# Patient Record
Sex: Female | Born: 1995 | Race: White | Hispanic: No | Marital: Married | Smoking: Never smoker
Health system: Southern US, Community
[De-identification: ages and names within clinical notes are randomized; demographics above are authoritative.]

## PROBLEM LIST (undated history)

## (undated) DIAGNOSIS — M419 Scoliosis, unspecified: Secondary | ICD-10-CM

## (undated) DIAGNOSIS — Z789 Other specified health status: Secondary | ICD-10-CM

## (undated) DIAGNOSIS — D696 Thrombocytopenia, unspecified: Secondary | ICD-10-CM

## (undated) HISTORY — PX: WISDOM TOOTH EXTRACTION: SHX21

## (undated) HISTORY — DX: Scoliosis, unspecified: M41.9

## (undated) HISTORY — DX: Thrombocytopenia, unspecified: D69.6

## (undated) HISTORY — DX: Other specified health status: Z78.9

---
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
## 2014-01-31 ENCOUNTER — Telehealth: Payer: Self-pay | Admitting: Nurse Practitioner

## 2014-01-31 NOTE — Telephone Encounter (Signed)
appt made

## 2014-02-01 ENCOUNTER — Telehealth: Payer: Self-pay | Admitting: Nurse Practitioner

## 2014-02-01 NOTE — Telephone Encounter (Signed)
DECIDED TO KEEP APPT.

## 2014-02-11 ENCOUNTER — Ambulatory Visit (INDEPENDENT_AMBULATORY_CARE_PROVIDER_SITE_OTHER): Payer: BC Managed Care – PPO | Admitting: Physician Assistant

## 2014-02-11 ENCOUNTER — Ambulatory Visit: Payer: Self-pay | Admitting: Physician Assistant

## 2014-02-11 ENCOUNTER — Encounter: Payer: Self-pay | Admitting: Physician Assistant

## 2014-02-11 ENCOUNTER — Encounter (INDEPENDENT_AMBULATORY_CARE_PROVIDER_SITE_OTHER): Payer: Self-pay

## 2014-02-11 VITALS — BP 126/77 | HR 97 | Temp 98.3°F | Ht 66.0 in | Wt 138.8 lb

## 2014-02-11 DIAGNOSIS — Z111 Encounter for screening for respiratory tuberculosis: Secondary | ICD-10-CM

## 2014-02-11 DIAGNOSIS — Z23 Encounter for immunization: Secondary | ICD-10-CM

## 2014-02-11 DIAGNOSIS — Z020 Encounter for examination for admission to educational institution: Secondary | ICD-10-CM

## 2014-02-11 NOTE — Progress Notes (Signed)
Patient ID: Lori Porter, female   DOB: 1996/01/14, 18 y.o.   MRN: [id number]   18 y/o healthy appearing  female presents for immunization update for college. No complaints today.   A/P: Forms reviewed. PPD given. Patient will rtc Monday for reading.

## 2014-02-12 LAB — VARICELLA ZOSTER ANTIBODY, IGG: Varicella zoster IgG: 764 index (ref >165)

## 2014-03-26 IMAGING — CR DG CHEST 2V
2 series · 2 of 2 positions shown · non-contrast
Comparison: None.

CLINICAL DATA: Sudden right chest pain

CHEST - 2 VIEW

[view not recorded (1 of 2)]
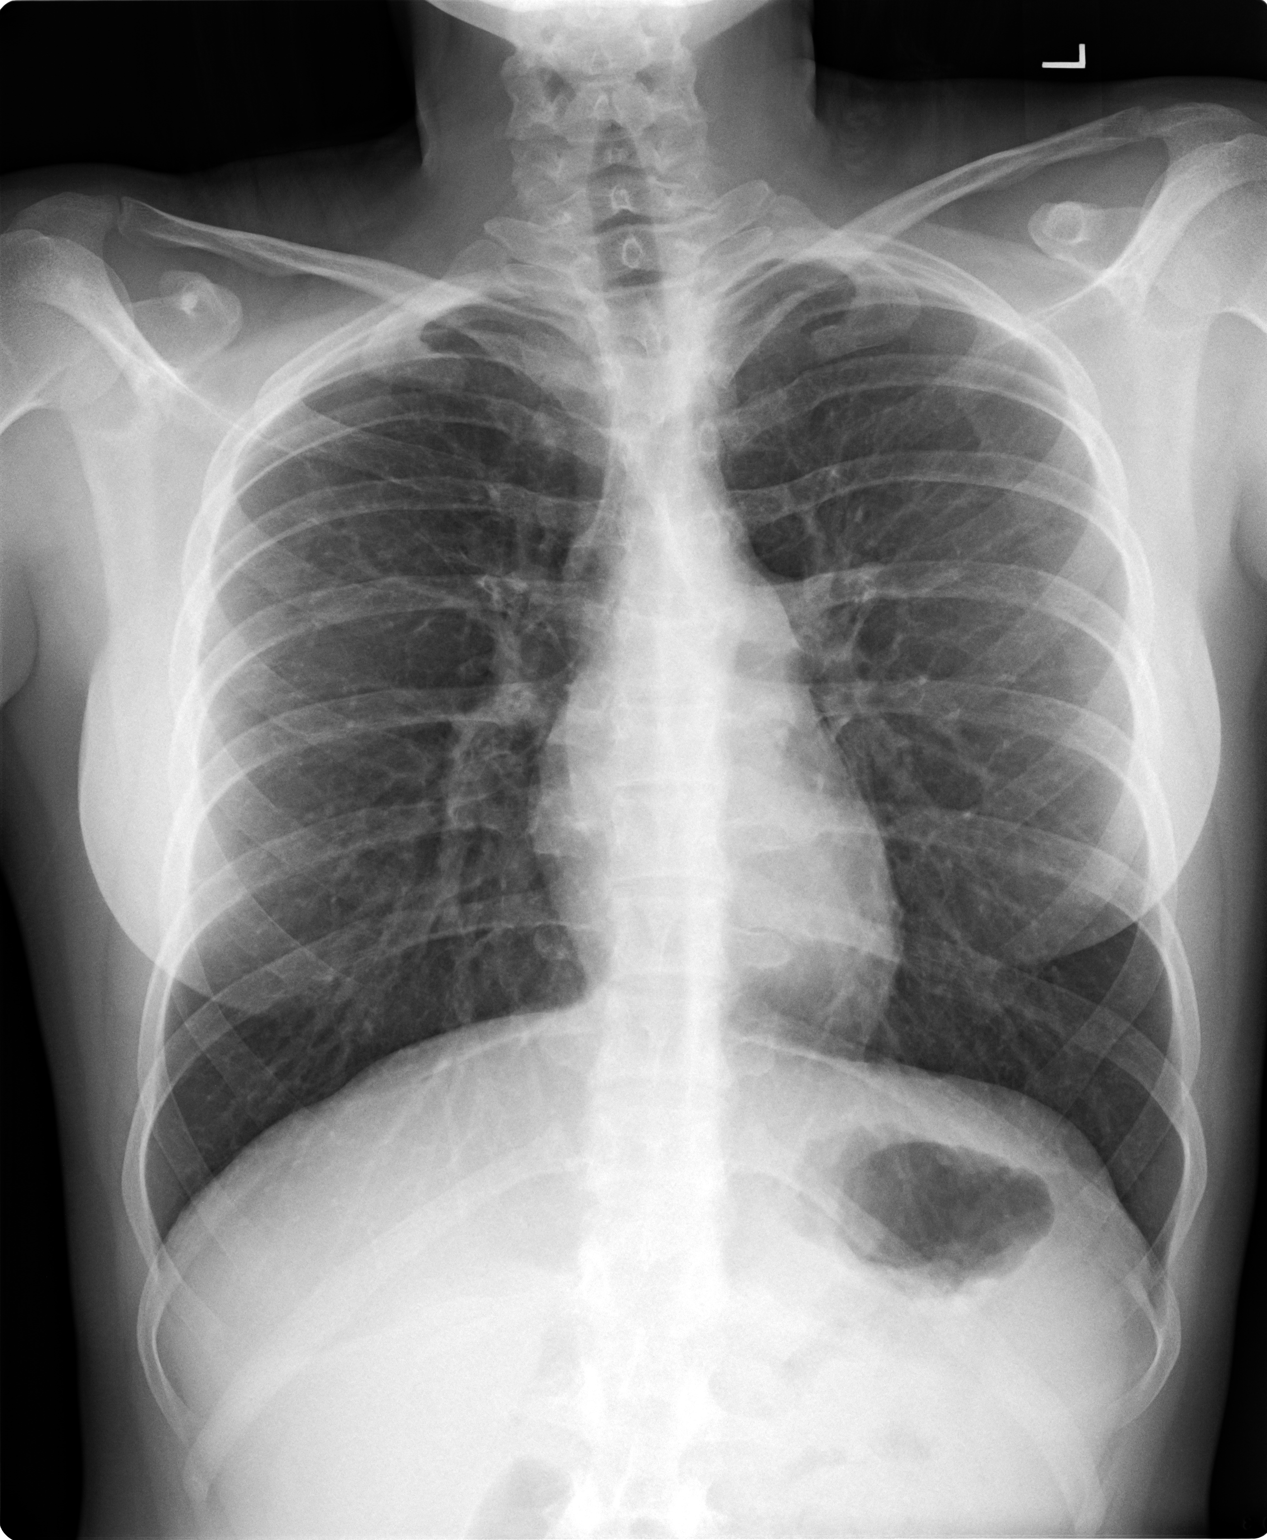

[view not recorded (2 of 2)]
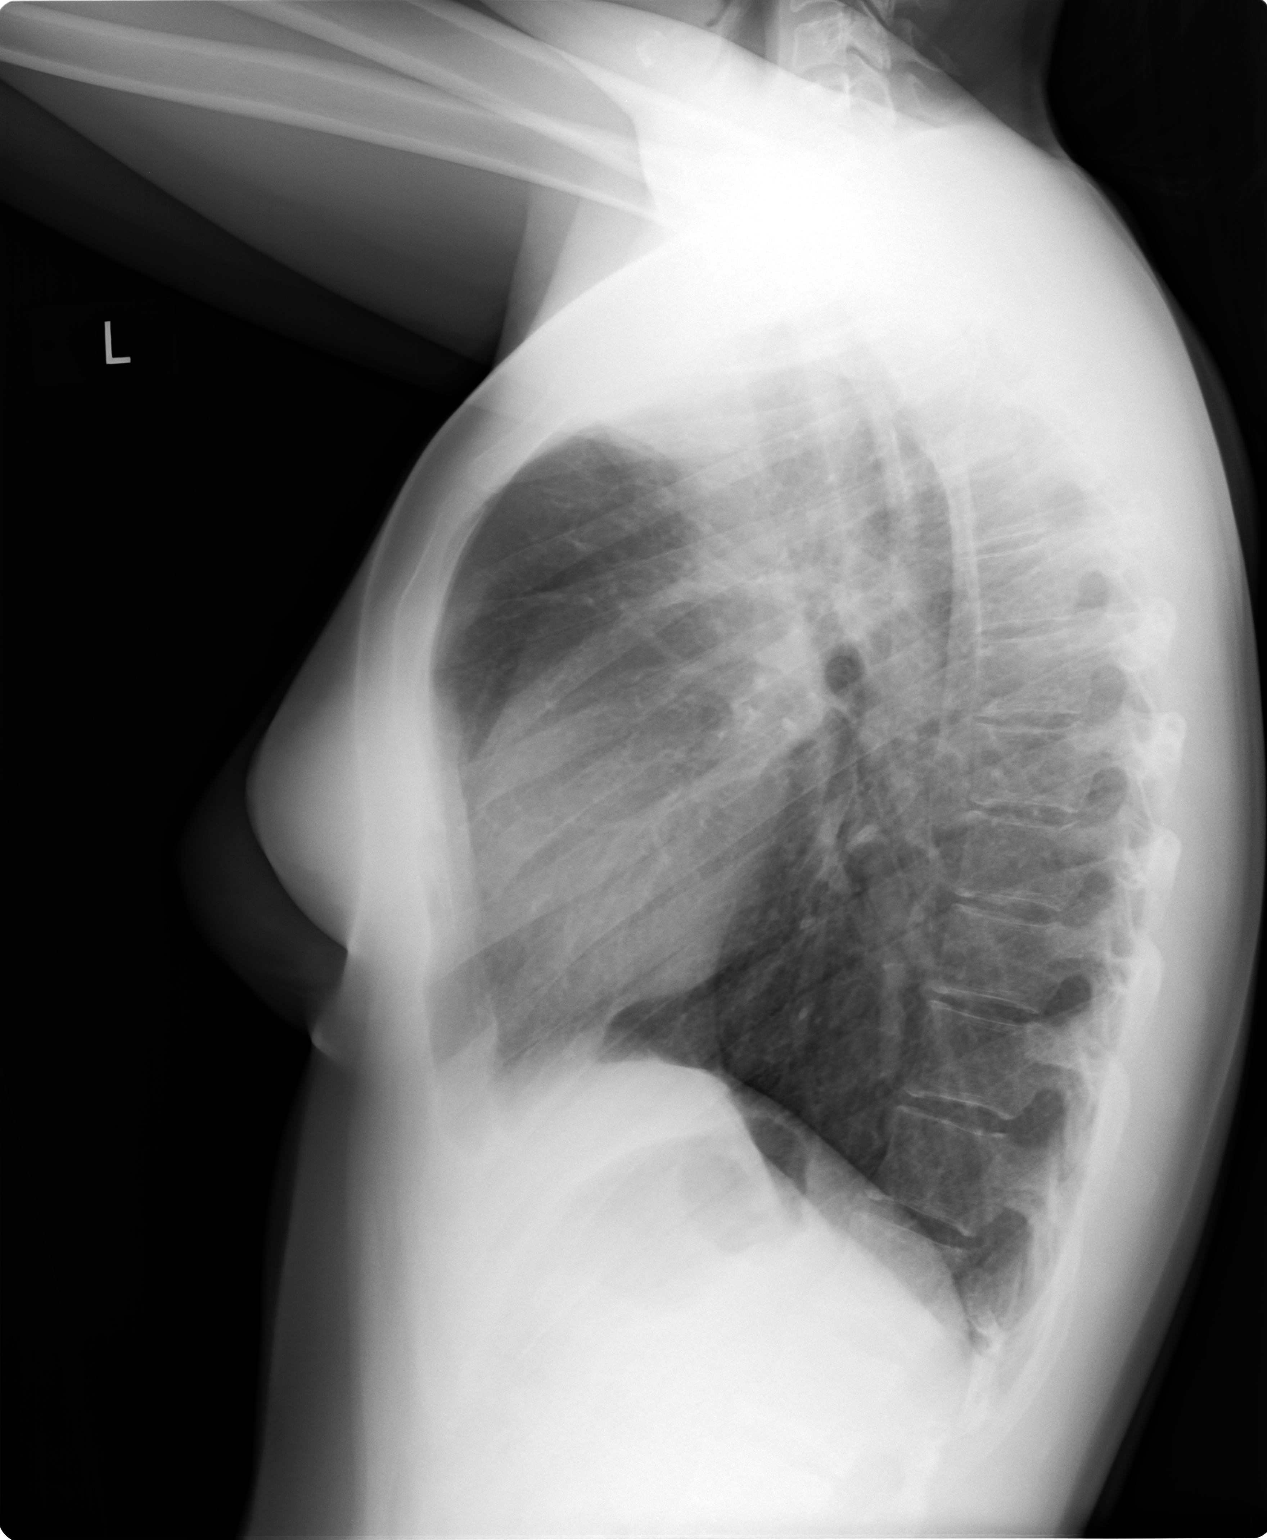

[2 of 2 positions shown; findings below may reference images not displayed]

FINDINGS: The heart and pulmonary vascularity are within normal
limits.  The lungs are clear bilaterally.  No acute bony
abnormality is seen.
IMPRESSION: No acute abnormality noted.

## 2014-07-20 ENCOUNTER — Telehealth: Payer: Self-pay | Admitting: Family Medicine

## 2014-07-20 NOTE — Telephone Encounter (Signed)
Pt aware shot records from the [facility] are at the front desk ready for pickup

## 2015-02-02 ENCOUNTER — Ambulatory Visit (INDEPENDENT_AMBULATORY_CARE_PROVIDER_SITE_OTHER): Payer: 59 | Admitting: Family Medicine

## 2015-02-02 ENCOUNTER — Encounter: Payer: Self-pay | Admitting: Family Medicine

## 2015-02-02 VITALS — BP 132/80 | HR 78 | Temp 97.6°F | Ht 66.0 in | Wt 139.2 lb

## 2015-02-02 DIAGNOSIS — R5383 Other fatigue: Secondary | ICD-10-CM

## 2015-02-02 DIAGNOSIS — E041 Nontoxic single thyroid nodule: Secondary | ICD-10-CM

## 2015-02-02 NOTE — Progress Notes (Signed)
BP 132/80 mmHg  Pulse 78  Temp(Src) 97.6 F (36.4 C) (Oral)  Ht  (1.676 m)  Wt 139 lb 3.2 oz (63.141 kg)  BMI 22.48 kg/m2   Subjective:    Patient ID: Lori Porter, female    DOB: 1995-09-19, 19 y.o.   MRN: [id number]  HPI: Lori Porter is a 19 y.o. female presenting on 02/02/2015 for thyroid check   HPI Fatigue and thyroid nodule Patient is a radiology Engineering geologist and when they were practicing thyroid scans on each other they found 2 small nodules one of which appeared to be cystic and the other not as much. She is also been having some fatigue but denies any hair changes, palpitations, constipation or diarrhea. She denies any chest pain or shortness of breath.  Relevant past medical, surgical, family and social history reviewed and updated as indicated. Interim medical history since our last visit reviewed. Allergies and medications reviewed and updated.  Review of Systems  Constitutional: Positive for fatigue. Negative for fever and chills.  HENT: Negative for congestion, ear discharge and ear pain.   Eyes: Negative for redness and visual disturbance.  Respiratory: Negative for cough, choking, chest tightness and shortness of breath.   Cardiovascular: Negative for chest pain and leg swelling.  Genitourinary: Negative for dysuria and difficulty urinating.  Musculoskeletal: Negative for back pain and gait problem.  Skin: Negative for rash.  Neurological: Negative for light-headedness and headaches.  Psychiatric/Behavioral: Negative for behavioral problems and agitation.  All other systems reviewed and are negative.   Per HPI unless specifically indicated above     Medication List       This list is accurate as of: 02/02/15  4:19 PM.  Always use your most recent med list.               levonorgestrel-ethinyl estradiol 0.1-20 MG-MCG tablet  Commonly known as:  ORSYTHIA  Take 1 tablet by mouth daily.     senna 8.6 MG tablet  Commonly known as:   SENOKOT  Take 1 tablet by mouth daily.           Objective:    BP 132/80 mmHg  Pulse 78  Temp(Src) 97.6 F (36.4 C) (Oral)  Ht  (1.676 m)  Wt 139 lb 3.2 oz (63.141 kg)  BMI 22.48 kg/m2  Wt Readings from Last 3 Encounters:  02/02/15 139 lb 3.2 oz (63.141 kg)  02/11/14 138 lb 12.8 oz (62.959 kg)  09/03/10 133 lb (60.328 kg) (71 %*, Z = 0.55)   * Growth percentiles are based on CDC 2-20 Years data.    Physical Exam  Constitutional: She is oriented to person, place, and time. She appears well-developed and well-nourished. No distress.  Eyes: Conjunctivae and EOM are normal. Pupils are equal, round, and reactive to light.  Neck: Neck supple. No thyromegaly (No palpable nodule) present.  Cardiovascular: Normal rate, regular rhythm, normal heart sounds and intact distal pulses.   No murmur heard. Pulmonary/Chest: Effort normal and breath sounds normal. No respiratory distress. She has no wheezes.  Musculoskeletal: Normal range of motion. She exhibits no edema or tenderness.  Lymphadenopathy:    She has no cervical adenopathy.  Neurological: She is alert and oriented to person, place, and time. Coordination normal.  Skin: Skin is warm and dry. No rash noted. She is not diaphoretic.  Psychiatric: She has a normal mood and affect. Her behavior is normal.  Nursing note and vitals reviewed.   Results for  orders placed or performed in visit on 02/11/14  Varicella zoster antibody, IgG  Result Value Ref Range   Varicella zoster IgG 764 Immune >165 index      Assessment & Plan:   Problem List Items Addressed This Visit    None    Visit Diagnoses    Other fatigue    -  Primary    Relevant Orders    CBC with Differential/Platelet (Completed)    Thyroid Panel With TSH (Completed)    Thyroid nodule        Had thyroid nodule on practice tests at school. We'll test TSH and likely do an ultrasound if TSH is normal.        Follow up plan: Return if symptoms worsen or fail to  improve.  Arville CareJoshua Dettinger, MD [facility] 02/02/2015, 4:19 PM

## 2015-02-03 ENCOUNTER — Other Ambulatory Visit: Payer: Self-pay | Admitting: *Deleted

## 2015-02-03 DIAGNOSIS — E042 Nontoxic multinodular goiter: Secondary | ICD-10-CM

## 2015-02-03 LAB — CBC WITH DIFFERENTIAL/PLATELET
BASOS ABS: 0.1 10*3/uL (ref 0.0–0.2)
Basos: 1 %
EOS (ABSOLUTE): 0.1 10*3/uL (ref 0.0–0.4)
Eos: 1 %
Hematocrit: 39.8 % (ref 34.0–46.6)
Hemoglobin: 13.4 g/dL (ref 11.1–15.9)
IMMATURE GRANS (ABS): 0 10*3/uL (ref 0.0–0.1)
Immature Granulocytes: 0 %
LYMPHS ABS: 2.3 10*3/uL (ref 0.7–3.1)
LYMPHS: 35 %
MCH: 29.8 pg (ref 26.6–33.0)
MCHC: 33.7 g/dL (ref 31.5–35.7)
MCV: 88 fL (ref 79–97)
MONOS ABS: 0.5 10*3/uL (ref 0.1–0.9)
Monocytes: 7 %
NEUTROS ABS: 3.6 10*3/uL (ref 1.4–7.0)
Neutrophils: 56 %
PLATELETS: 213 10*3/uL (ref 150–379)
RBC: 4.5 x10E6/uL (ref 3.77–5.28)
RDW: 12.9 % (ref 12.3–15.4)
WBC: 6.5 10*3/uL (ref 3.4–10.8)

## 2015-02-03 LAB — THYROID PANEL WITH TSH
Free Thyroxine Index: 2.5 (ref 1.2–4.9)
T3 Uptake Ratio: 21 % — ABNORMAL LOW (ref 24–39)
T4, Total: 12 ug/dL (ref 4.5–12.0)
TSH: 1.02 u[IU]/mL (ref 0.450–4.500)

## 2015-02-09 ENCOUNTER — Telehealth: Payer: Self-pay | Admitting: Family Medicine

## 2015-02-10 NOTE — Telephone Encounter (Signed)
LMOM as per DPR of 02/02/2015 with [facility] need [facility] or [facility]  02/15/2015 at 2:15  No prep

## 2015-02-14 ENCOUNTER — Ambulatory Visit (HOSPITAL_COMMUNITY)
Admission: RE | Admit: 2015-02-14 | Discharge: 2015-02-14 | Disposition: A | Discharge status: Home / Self Care | Payer: 59 | Source: Ambulatory Visit | Attending: Family Medicine | Admitting: Family Medicine

## 2015-02-14 DIAGNOSIS — E042 Nontoxic multinodular goiter: Secondary | ICD-10-CM | POA: Diagnosis present

## 2015-02-15 ENCOUNTER — Telehealth: Payer: Self-pay | Admitting: Family Medicine

## 2015-02-15 ENCOUNTER — Other Ambulatory Visit (HOSPITAL_COMMUNITY): Payer: Managed Care, Other (non HMO)

## 2015-02-15 NOTE — Telephone Encounter (Signed)
Pt aware of ultrasound results and understanding.

## 2016-05-29 DIAGNOSIS — R0981 Nasal congestion: Secondary | ICD-10-CM | POA: Diagnosis not present

## 2016-05-29 DIAGNOSIS — R6889 Other general symptoms and signs: Secondary | ICD-10-CM | POA: Diagnosis not present

## 2016-06-13 DIAGNOSIS — L700 Acne vulgaris: Secondary | ICD-10-CM | POA: Diagnosis not present

## 2017-01-29 DIAGNOSIS — S53401A Unspecified sprain of right elbow, initial encounter: Secondary | ICD-10-CM | POA: Diagnosis not present

## 2017-01-29 DIAGNOSIS — M25521 Pain in right elbow: Secondary | ICD-10-CM | POA: Diagnosis not present

## 2017-01-29 DIAGNOSIS — S59901A Unspecified injury of right elbow, initial encounter: Secondary | ICD-10-CM | POA: Diagnosis not present

## 2017-01-29 DIAGNOSIS — S199XXA Unspecified injury of neck, initial encounter: Secondary | ICD-10-CM | POA: Diagnosis not present

## 2017-01-29 DIAGNOSIS — M542 Cervicalgia: Secondary | ICD-10-CM | POA: Diagnosis not present

## 2017-01-29 DIAGNOSIS — S161XXA Strain of muscle, fascia and tendon at neck level, initial encounter: Secondary | ICD-10-CM | POA: Diagnosis not present

## 2017-02-10 DIAGNOSIS — S53401A Unspecified sprain of right elbow, initial encounter: Secondary | ICD-10-CM | POA: Diagnosis not present

## 2017-02-10 DIAGNOSIS — M25521 Pain in right elbow: Secondary | ICD-10-CM | POA: Diagnosis not present
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
## 2017-02-25 ENCOUNTER — Encounter: Payer: Self-pay | Admitting: Nurse Practitioner

## 2017-02-25 ENCOUNTER — Ambulatory Visit (INDEPENDENT_AMBULATORY_CARE_PROVIDER_SITE_OTHER): Payer: 59 | Admitting: Nurse Practitioner

## 2017-02-25 ENCOUNTER — Ambulatory Visit: Payer: 59 | Admitting: Family Medicine

## 2017-02-25 VITALS — BP 119/75 | HR 87 | Temp 98.4°F | Ht 66.0 in | Wt 143.0 lb

## 2017-02-25 DIAGNOSIS — S53401A Unspecified sprain of right elbow, initial encounter: Secondary | ICD-10-CM
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
## 2017-02-28 ENCOUNTER — Telehealth: Payer: Self-pay

## 2017-02-28 DIAGNOSIS — M25521 Pain in right elbow: Secondary | ICD-10-CM

## 2017-02-28 NOTE — Telephone Encounter (Signed)
Wants a referral for Ortho for my arm in [place]

## 2017-03-04 ENCOUNTER — Telehealth: Payer: Self-pay

## 2017-03-04 NOTE — Addendum Note (Signed)
Addended by: Bennie PieriniMARTIN, MARY-MARGARET on: 03/04/2017 02:00 PM   Modules accepted: Orders

## 2017-03-04 NOTE — Telephone Encounter (Signed)
Patient was told if no better in 1 month would do ortho referral- does she feel like sh eneeds to go ahead with referral

## 2017-03-04 NOTE — Telephone Encounter (Signed)
Yes please refer

## 2017-03-04 NOTE — Telephone Encounter (Signed)
Saw you 02/25/17 for elbow pain   Thought she was getting a referral for ortho?

## 2017-03-05 ENCOUNTER — Other Ambulatory Visit: Payer: Self-pay | Admitting: Nurse Practitioner

## 2017-03-05 DIAGNOSIS — M25521 Pain in right elbow: Secondary | ICD-10-CM

## 2017-05-21 DIAGNOSIS — Z01419 Encounter for gynecological examination (general) (routine) without abnormal findings: Secondary | ICD-10-CM | POA: Diagnosis not present

## 2017-05-21 DIAGNOSIS — Z6823 Body mass index (BMI) 23.0-23.9, adult: Secondary | ICD-10-CM | POA: Diagnosis not present

## 2017-09-09 ENCOUNTER — Ambulatory Visit: Payer: Self-pay | Admitting: Nurse Practitioner

## 2017-09-09 VITALS — BP 90/65 | HR 85 | Temp 98.7°F | Resp 16 | Wt 142.2 lb

## 2017-09-09 DIAGNOSIS — H6981 Other specified disorders of Eustachian tube, right ear: Secondary | ICD-10-CM

## 2017-09-09 MED ORDER — FLUTICASONE PROPIONATE 50 MCG/ACT NA SUSP: 2.0000 | Freq: Every day | NASAL | 0 refills | Status: DC

## 2017-09-09 NOTE — Progress Notes (Signed)
   Subjective:    Patient ID: Lori Porter, female    DOB: 03-May-1995, 22 y.o.   MRN: [id number]  The patient is a 22 year old female who presents today for bilateral ear pain.  The patient states about 5 days prior she developed some nasal congestion, sinus pressure, and postnasal drip.  Patient states she took Claritin for the symptoms which have since improved.  Patient noticed over the last day or so that her ears felt full, and likely had pressure.  Patient states the right ear is much worse than the left ear, and that her hearing sounds muffled today.  The patient denies fever, chills, ear drainage.  The patient does admit to a cough but states that is not bothersome at this time.  Otalgia   There is pain in both ears. This is a new problem. The current episode started in the past 7 days. The problem occurs constantly. The problem has been unchanged. There has been no fever. The pain is at a severity of 3/10. The pain is mild. Associated symptoms include coughing. Pertinent negatives include no ear discharge, rash, rhinorrhea or sore throat. Treatments tried: Claritin. The treatment provided no relief. There is no history of a chronic ear infection or hearing loss.    Review of Systems  Constitutional: Negative.   HENT: Positive for ear pain and postnasal drip. Negative for ear discharge, rhinorrhea, sinus pressure, sinus pain, sneezing and sore throat.   Eyes: Negative.   Respiratory: Positive for cough.   Cardiovascular: Negative.   Gastrointestinal: Negative.   Skin: Negative for rash.      Objective:   Physical Exam  Constitutional: She is oriented to person, place, and time. She appears well-developed and well-nourished. No distress.  HENT:  Head: Normocephalic and atraumatic.  Eyes: Pupils are equal, round, and reactive to light. EOM are normal. Right eye exhibits no discharge. Left eye exhibits no discharge.  Neck: Normal range of motion. Neck supple. No tracheal deviation  present. No thyromegaly present.  Cardiovascular: Normal rate, regular rhythm and normal heart sounds.  Pulmonary/Chest: Effort normal and breath sounds normal.  Abdominal: Soft. Bowel sounds are normal.  Neurological: She is alert and oriented to person, place, and time.  Skin: Skin is warm and dry. Capillary refill takes less than 2 seconds.  Psychiatric: She has a normal mood and affect.  Vitals reviewed.     Assessment & Plan:  Eustachian Tube, Dysfunction 1.  Fluticasone nasal spray, 2 sprays in each nostril for 10 days or until symptoms improve. 2.  Benadryl 25 mg tablet, 1 tablet at bedtime until symptoms improve. 3.  Follow-up if change in hearing, drainage, or continued ear pain. 4.  Warm compresses to the affected ear until symptoms improve. 5.  Ibuprofen or Tylenol for pain, fever, or general discomfort. 6.  Patient education provided. 7.  Patient verbalizes understanding and has no questions at time of discharge. Meds ordered this encounter  Medications  . fluticasone (FLONASE) 50 MCG/ACT nasal spray    Sig: Place 2 sprays into both nostrils daily for 10 days.    Dispense:  16 g    Refill:  0    Order Specific Question:   Supervising Provider    Answer:   Stacie GlazeJENKINS, JOHN E [phone]]

## 2017-09-09 NOTE — Patient Instructions (Signed)
Eustachian Tube Dysfunction The eustachian tube connects the middle ear to the back of the nose. It regulates air pressure in the middle ear by allowing air to move between the ear and nose. It also helps to drain fluid from the middle ear space. When the eustachian tube does not function properly, air pressure, fluid, or both can build up in the middle ear. Eustachian tube dysfunction can affect one or both ears. What are the causes? This condition happens when the eustachian tube becomes blocked or cannot open normally. This may result from:  Ear infections.  Colds and other upper respiratory infections.  Allergies.  Irritation, such as from cigarette smoke or acid from the stomach coming up into the esophagus (gastroesophageal reflux).  Sudden changes in air pressure, such as from descending in an airplane.  Abnormal growths in the nose or throat, such as nasal polyps, tumors, or enlarged tissue at the back of the throat (adenoids).  What increases the risk? This condition may be more likely to develop in people who smoke and people who are overweight. Eustachian tube dysfunction may also be more likely to develop in children, especially children who have:  Certain birth defects of the mouth, such as cleft palate.  Large tonsils and adenoids.  What are the signs or symptoms? Symptoms of this condition may include:  A feeling of fullness in the ear.  Ear pain.  Clicking or popping noises in the ear.  Ringing in the ear.  Hearing loss.  Loss of balance.  Symptoms may get worse when the air pressure around you changes, such as when you travel to an area of high elevation or fly on an airplane. How is this diagnosed? This condition may be diagnosed based on:  Your symptoms.  A physical exam of your ear, nose, and throat.  Tests, such as those that measure: ? The movement of your eardrum (tympanogram). ? Your hearing (audiometry).  How is this treated? Treatment  depends on the cause and severity of your condition. If your symptoms are mild, you may be able to relieve your symptoms by moving air into ("popping") your ears. If you have symptoms of fluid in your ears, treatment may include:  Decongestants.  Antihistamines.  Nasal sprays or ear drops that contain medicines that reduce swelling (steroids).  In some cases, you may need to have a procedure to drain the fluid in your eardrum (myringotomy). In this procedure, a small tube is placed in the eardrum to:  Drain the fluid.  Restore the air in the middle ear space.  Follow these instructions at home:  Take over-the-counter and prescription medicines only as told by your health care provider.  Use techniques to help pop your ears as recommended by your health care provider. These may include: ? Chewing gum. ? Yawning. ? Frequent, forceful swallowing. ? Closing your mouth, holding your nose closed, and gently blowing as if you are trying to blow air out of your nose.  Do not do any of the following until your health care provider approves: ? Travel to high altitudes. ? Fly in airplanes. ? Work in a Estate agentpressurized cabin or room. ? Scuba dive.  Keep your ears dry. Dry your ears completely after showering or bathing.  Do not smoke.  Keep all follow-up visits as told by your health care provider. This is important.  FLUTICASONE NASAL SPRAY, 2 SPRAYS IN EACH NOSTRIL DAILY UNTIL SYMPTOMS IMPROVE.  BENDADRYL 25MG - 1 TABLET AT BEDTIME UNTIL SYMPTOMS IMPROVE.  Contact  a health care provider if:  Your symptoms do not go away after treatment.  Your symptoms come back after treatment.  You are unable to pop your ears.  You have: ? A fever. ? Pain in your ear. ? Pain in your head or neck. ? Fluid draining from your ear.  Your hearing suddenly changes.  You become very dizzy.  You lose your balance. This information is not intended to replace advice given to you by your health care  provider. Make sure you discuss any questions you have with your health care provider. Document Released: 04/14/2015 Document Revised: 08/24/2015 Document Reviewed: 04/06/2014 Elsevier Interactive Patient Education  [employer].

## 2017-09-12 IMAGING — US US SOFT TISSUE HEAD/NECK
1 series · 14 of 25 positions shown · non-contrast
Comparison: None.

CLINICAL DATA: Bilateral thyroid nodules

EXAM:
THYROID ULTRASOUND
TECHNIQUE: Ultrasound examination of the thyroid gland and adjacent soft
tissues was performed.

[Series 1: us soft tissue head/neck · 0.05mm/px · 14 of 53 slices shown]
[im 1/53]
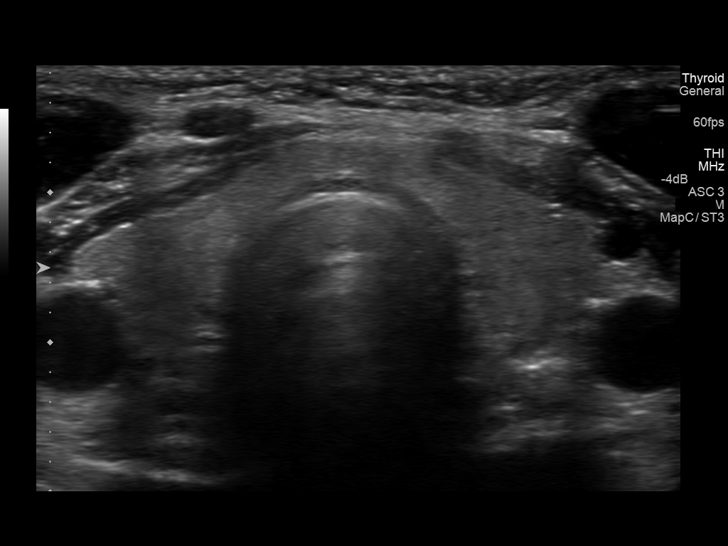
[im 5/53]
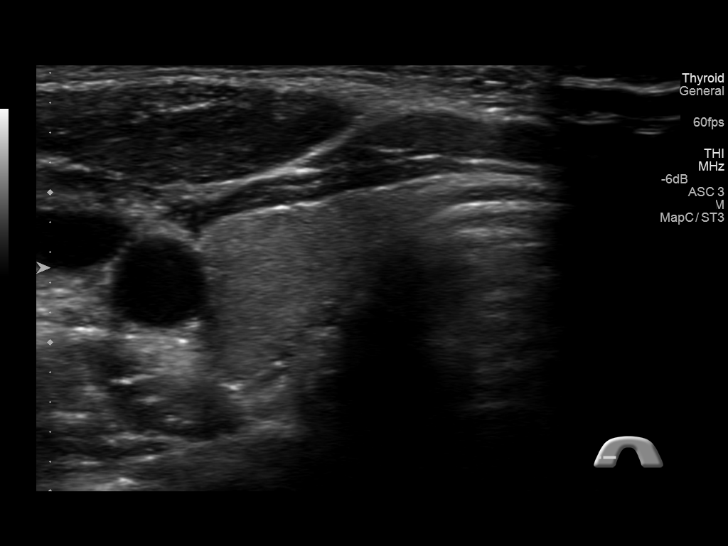
[im 9/53]
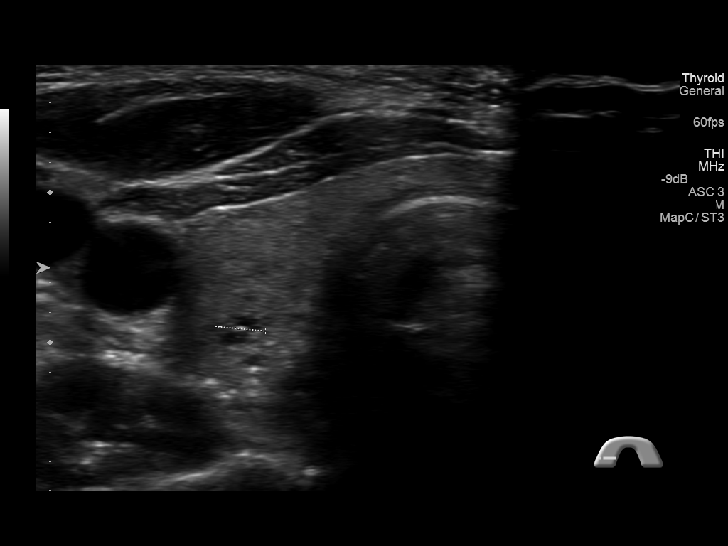
[im 14/53]
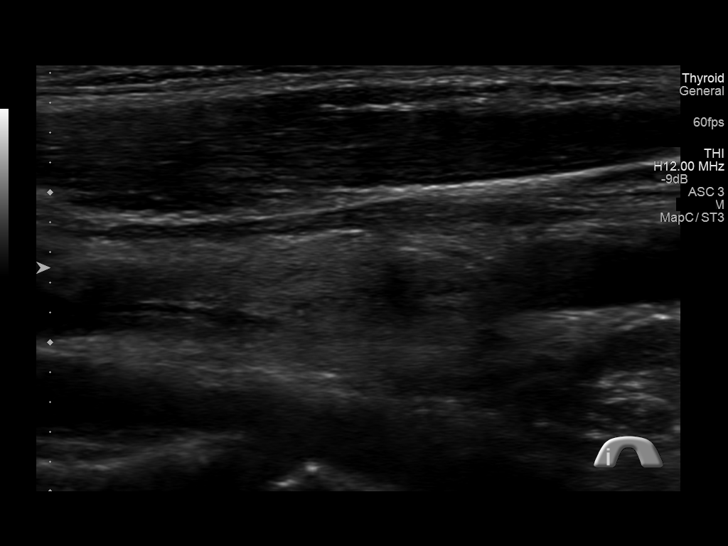
[im 18/53]
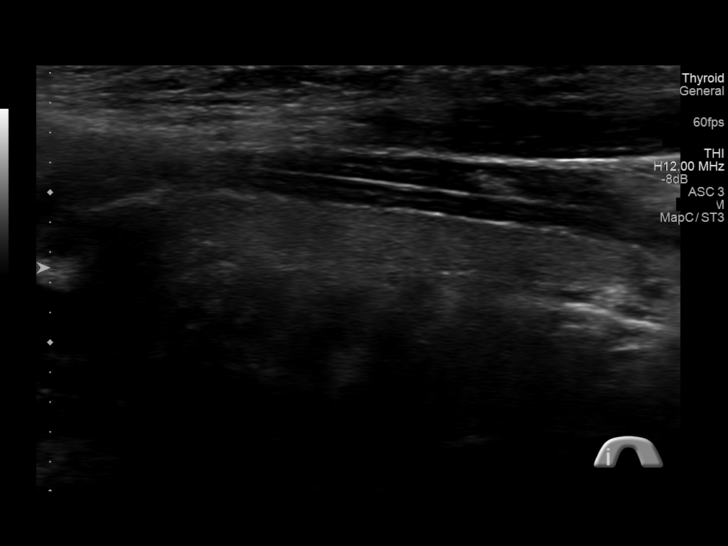
[im 20/53]
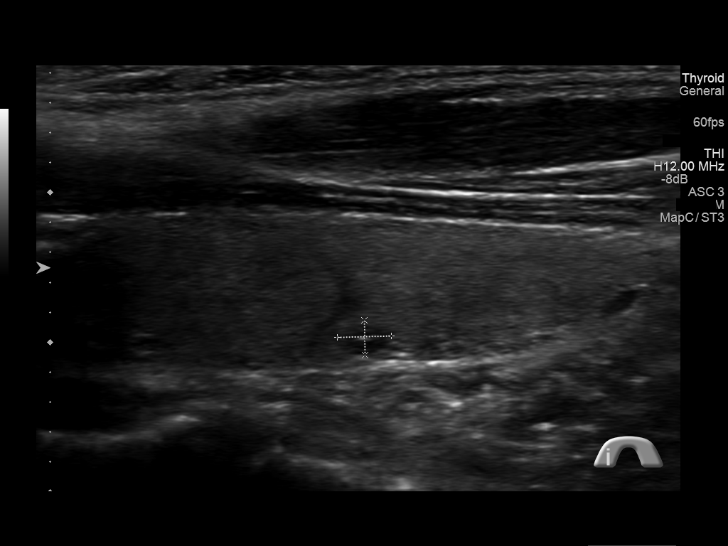
[im 24/53]
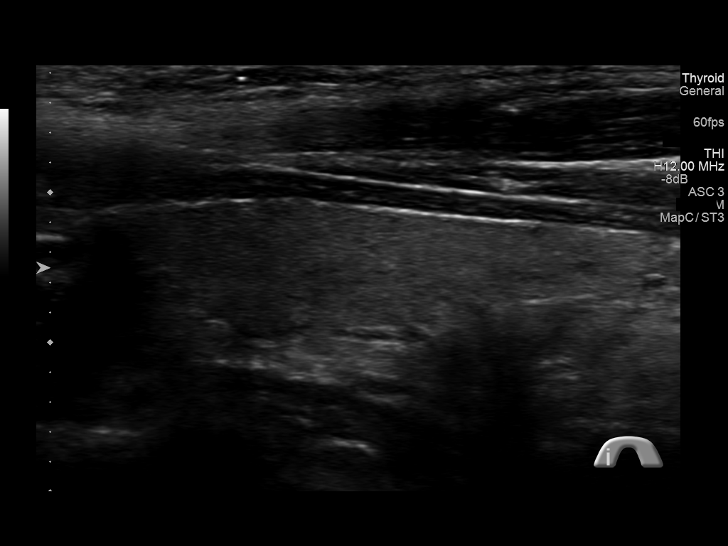
[im 29/53]
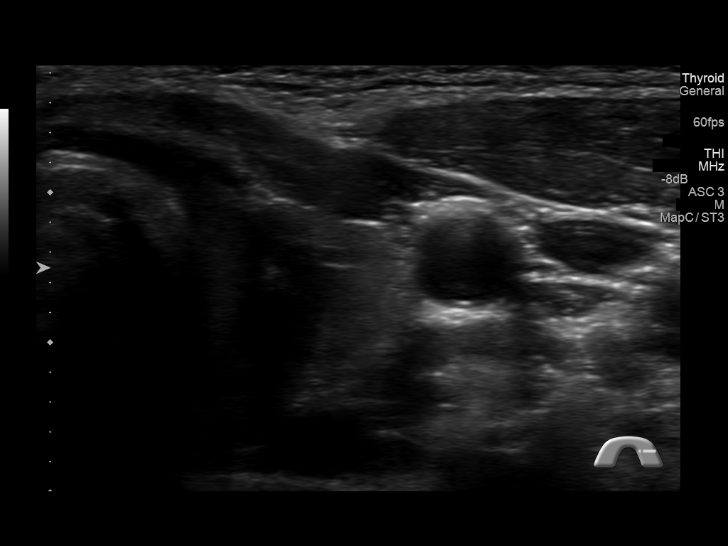
[im 33/53]
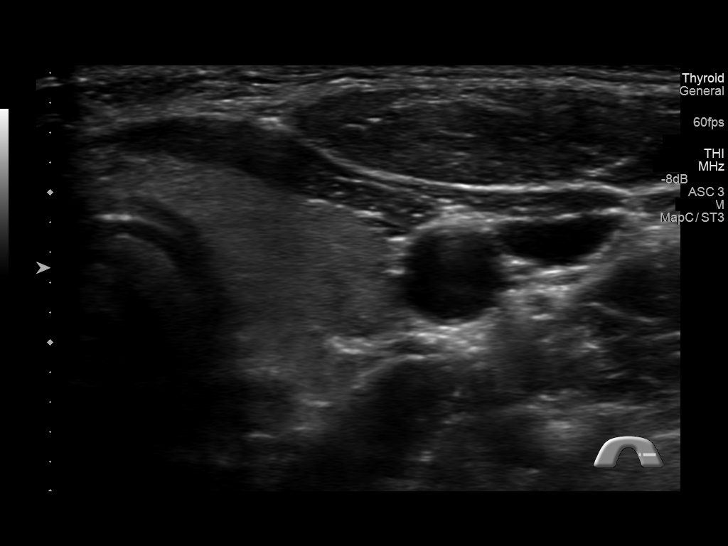
[im 35/53]
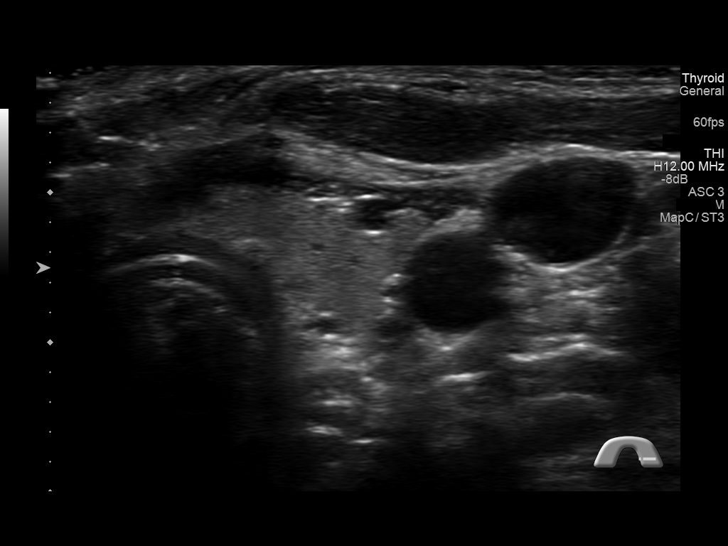
[im 40/53]
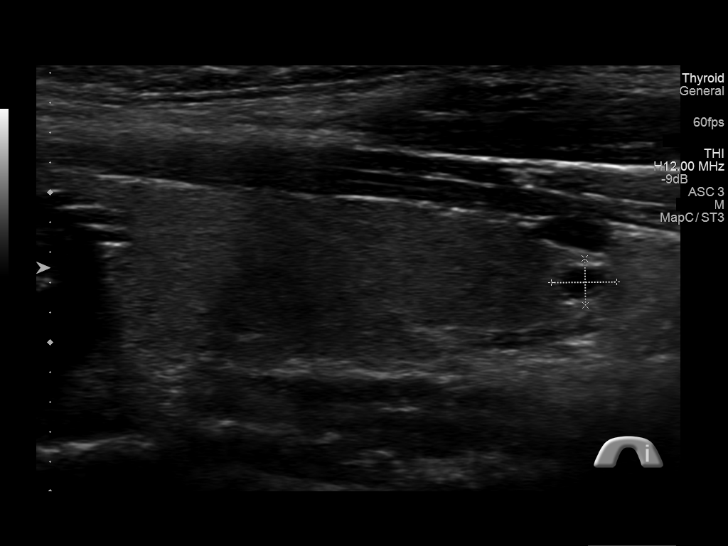
[im 44/53]
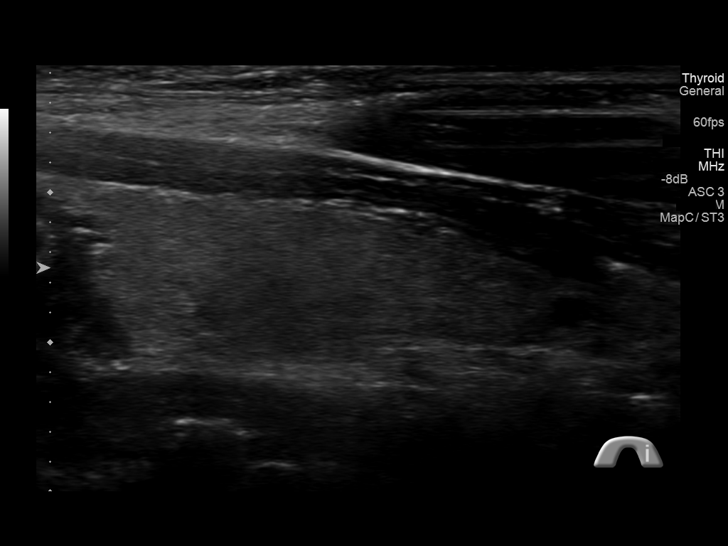
[im 48/53]
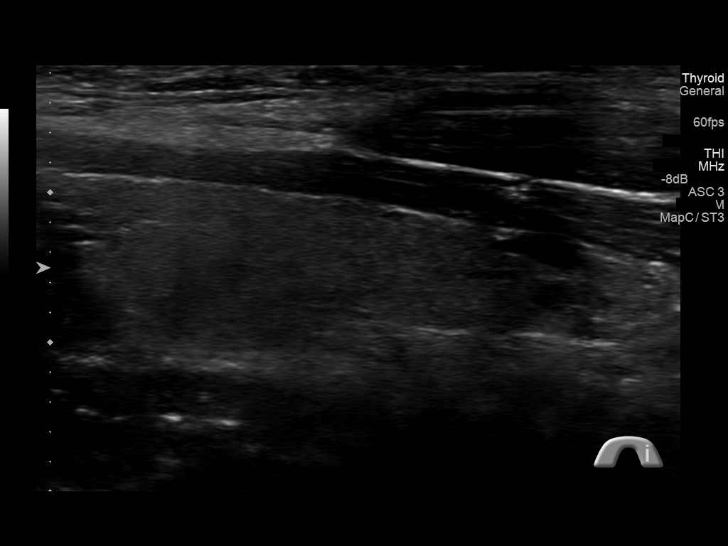
[im 53/53]
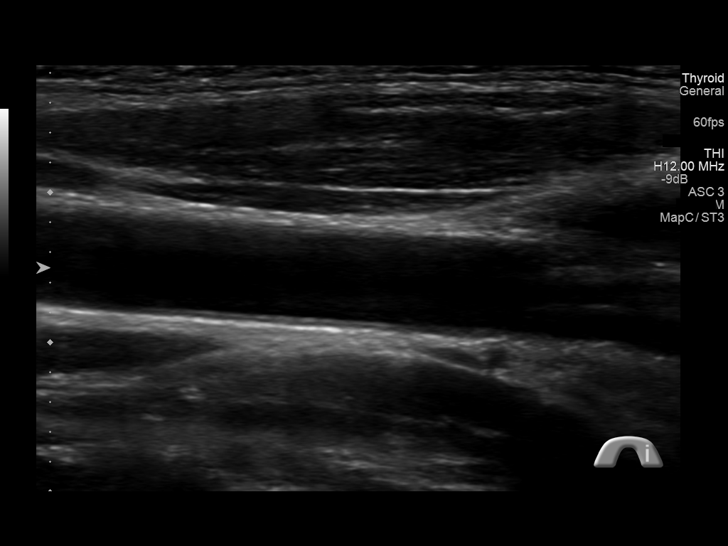

[14 of 25 positions shown; findings below may reference images not displayed]

FINDINGS: Right thyroid lobe

Measurements: 4.3 x 1.1 x 1.3 cm. Homogeneous background
echotexture. Tiny hypoechoic right lower pole thyroid nodule
measuring only 4 mm. No other significant right thyroid abnormality.

Left thyroid lobe

Measurements: 4.2 x 1.1 x 1.3 cm. Homogeneous background
echotexture. Tiny hypoechoic cystic nodule in the left lower pole
also measuring 4 mm. No other significant left thyroid abnormality.

Isthmus

Thickness: 3 mm.  No nodules visualized.

Lymphadenopathy

None visualized.
IMPRESSION: Tiny lower pole nodules bilaterally, 4 mm or less in size.

Findings do not meet current SRU consensus criteria for biopsy.
Follow-up by clinical exam is recommended. If patient has known risk
factors for thyroid carcinoma, consider follow-up ultrasound in 12
months. If patient is clinically hyperthyroid, consider nuclear
medicine thyroid uptake and scan.Reference: Management of Thyroid
Nodules Detected at US: Society of Radiologists in Ultrasound

## 2017-10-09 ENCOUNTER — Other Ambulatory Visit: Payer: Self-pay | Admitting: Nurse Practitioner

## 2017-11-05 ENCOUNTER — Ambulatory Visit (INDEPENDENT_AMBULATORY_CARE_PROVIDER_SITE_OTHER): Payer: 59 | Admitting: Family Medicine

## 2017-11-05 ENCOUNTER — Telehealth: Payer: Self-pay | Admitting: Family Medicine

## 2017-11-05 ENCOUNTER — Encounter: Payer: Self-pay | Admitting: Family Medicine

## 2017-11-05 VITALS — BP 131/80 | HR 85 | Temp 98.5°F | Ht 66.0 in | Wt 141.4 lb

## 2017-11-05 DIAGNOSIS — Z0000 Encounter for general adult medical examination without abnormal findings: Secondary | ICD-10-CM | POA: Diagnosis not present

## 2017-11-05 DIAGNOSIS — E041 Nontoxic single thyroid nodule: Secondary | ICD-10-CM | POA: Diagnosis not present

## 2017-11-05 NOTE — Progress Notes (Signed)
BP 131/80   Pulse 85   Temp 98.5 F (36.9 C) (Oral)   Ht _0  (1.676 m)   Wt 141 lb 6.4 oz (64.1 kg)   BMI 22.82 kg/m    Subjective:    Patient ID: Lori Porter, female    DOB: [birth year], 22 y.o.   MRN: [id number]  HPI: Lori Porter is a 22 y.o. female presenting on 11/05/2017 for Labs Only (Thyroid checked and check up)   HPI Adult exam and thyroid nodule recheck Patient is coming in today for well adult exam and thyroid nodule recheck.  She denies any symptoms from her thyroid.  She denies any difficulties with heat or cold or hair loss.  She is coming in for recheck of the nodule that she had skin a few years ago. Patient denies any chest pain, shortness of breath, headaches or vision issues, abdominal complaints, diarrhea, nausea, vomiting, or joint issues.   Relevant past medical, surgical, family and social history reviewed and updated as indicated. Interim medical history since our last visit reviewed. Allergies and medications reviewed and updated.  Review of Systems  Constitutional: Negative for chills and fever.  HENT: Negative for congestion, ear discharge and ear pain.   Eyes: Negative for visual disturbance.  Respiratory: Negative for chest tightness and shortness of breath.   Cardiovascular: Negative for chest pain and leg swelling.  Endocrine: Negative for cold intolerance, heat intolerance, polydipsia and polyuria.  Musculoskeletal: Negative for back pain and gait problem.  Skin: Negative for color change and rash.  Neurological: Negative for light-headedness and headaches.  Psychiatric/Behavioral: Negative for agitation and behavioral problems.  All other systems reviewed and are negative.   Per HPI unless specifically indicated above   Allergies as of 11/05/2017      Reactions   Oseltamivir Phosphate Other (See Comments)   Hallucinations   Amoxicillin Rash   Cephalexin Rash   Peanuts [nuts] Rash      Medication List        Accurate as  of 11/05/17  4:26 PM. Always use your most recent med list.          prenatal multivitamin Tabs tablet Take 1 tablet by mouth daily at 12 noon.          Objective:    BP 131/80   Pulse 85   Temp 98.5 F (36.9 C) (Oral)   Ht _1  (1.676 m)   Wt 141 lb 6.4 oz (64.1 kg)   BMI 22.82 kg/m   Wt Readings from Last 3 Encounters:  11/05/17 141 lb 6.4 oz (64.1 kg)  09/09/17 142 lb 3.2 oz (64.5 kg)  02/25/17 143 lb (64.9 kg)    Physical Exam  Constitutional: She is oriented to person, place, and time. She appears well-developed and well-nourished. No distress.  Eyes: Conjunctivae are normal.  Neck: Neck supple. No thyromegaly present.  Cardiovascular: Normal rate, regular rhythm, normal heart sounds and intact distal pulses.  No murmur heard. Pulmonary/Chest: Effort normal and breath sounds normal. No respiratory distress. She has no wheezes.  Musculoskeletal: Normal range of motion. She exhibits no edema or tenderness.  Lymphadenopathy:    She has no cervical adenopathy.  Neurological: She is alert and oriented to person, place, and time. Coordination normal.  Skin: Skin is warm and dry. No rash noted. She is not diaphoretic.  Psychiatric: She has a normal mood and affect. Her behavior is normal.  Nursing note and vitals reviewed.   Results for orders  placed or performed in visit on 02/02/15  CBC with Differential/Platelet  Result Value Ref Range   WBC 6.5 3.4 - 10.8 x10E3/uL   RBC 4.50 3.77 - 5.28 x10E6/uL   Hemoglobin 13.4 11.1 - 15.9 g/dL   Hematocrit 39.8 34.0 - 46.6 %   MCV 88 79 - 97 fL   MCH 29.8 26.6 - 33.0 pg   MCHC 33.7 31.5 - 35.7 g/dL   RDW 12.9 12.3 - 15.4 %   Platelets 213 150 - 379 x10E3/uL   Neutrophils 56 %   Lymphs 35 %   Monocytes 7 %   Eos 1 %   Basos 1 %   Neutrophils Absolute 3.6 1.4 - 7.0 x10E3/uL   Lymphocytes Absolute 2.3 0.7 - 3.1 x10E3/uL   Monocytes Absolute 0.5 0.1 - 0.9 x10E3/uL   EOS (ABSOLUTE) 0.1 0.0 - 0.4 x10E3/uL   Basophils  Absolute 0.1 0.0 - 0.2 x10E3/uL   Immature Granulocytes 0 %   Immature Grans (Abs) 0.0 0.0 - 0.1 x10E3/uL  Thyroid Panel With TSH  Result Value Ref Range   TSH 1.020 0.450 - 4.500 uIU/mL   T4, Total 12.0 4.5 - 12.0 ug/dL   T3 Uptake Ratio 21 (L) 24 - 39 %   Free Thyroxine Index 2.5 1.2 - 4.9      Assessment & Plan:   Problem List Items Addressed This Visit    None    Visit Diagnoses    Well adult exam    -  Primary   Relevant Orders   CBC with Differential/Platelet (Completed)   CMP14+EGFR (Completed)   Lipid panel (Completed)   Thyroid nodule       Relevant Orders   Thyroid Panel With TSH (Completed)   US THYROID (Completed)   CBC with Differential/Platelet (Completed)       Follow up plan: Return in about 1 year (around 11/06/2018), or if symptoms worsen or fail to improve.  Counseling provided for all of the vaccine components Orders Placed This Encounter  Procedures  . US THYROID  . Thyroid Panel With TSH  . CBC with Differential/Platelet  . CMP14+EGFR  . Lipid panel    Caryl Pina, MD [facility] 11/05/2017, 4:26 PM

## 2017-11-05 NOTE — Telephone Encounter (Signed)
Apt made with [facility] for today.

## 2017-11-06 ENCOUNTER — Ambulatory Visit (HOSPITAL_COMMUNITY)
Admission: RE | Admit: 2017-11-06 | Discharge: 2017-11-06 | Disposition: A | Discharge status: Home / Self Care | Payer: 59 | Source: Ambulatory Visit | Attending: Family Medicine | Admitting: Family Medicine

## 2017-11-06 DIAGNOSIS — E041 Nontoxic single thyroid nodule: Secondary | ICD-10-CM | POA: Diagnosis present

## 2017-11-06 LAB — CBC WITH DIFFERENTIAL/PLATELET
BASOS: 1 %
Basophils Absolute: 0 10*3/uL (ref 0.0–0.2)
EOS (ABSOLUTE): 0.1 10*3/uL (ref 0.0–0.4)
EOS: 2 %
HEMATOCRIT: 43.5 % (ref 34.0–46.6)
Hemoglobin: 14.7 g/dL (ref 11.1–15.9)
IMMATURE GRANS (ABS): 0 10*3/uL (ref 0.0–0.1)
Immature Granulocytes: 0 %
LYMPHS: 46 %
Lymphocytes Absolute: 3.9 10*3/uL — ABNORMAL HIGH (ref 0.7–3.1)
MCH: 28.9 pg (ref 26.6–33.0)
MCHC: 33.8 g/dL (ref 31.5–35.7)
MCV: 86 fL (ref 79–97)
Monocytes Absolute: 0.6 10*3/uL (ref 0.1–0.9)
Monocytes: 7 %
Neutrophils Absolute: 3.6 10*3/uL (ref 1.4–7.0)
Neutrophils: 44 %
Platelets: 253 10*3/uL (ref 150–450)
RBC: 5.08 x10E6/uL (ref 3.77–5.28)
RDW: 13 % (ref 12.3–15.4)
WBC: 8.3 10*3/uL (ref 3.4–10.8)

## 2017-11-06 LAB — CMP14+EGFR
ALBUMIN: 4.9 g/dL (ref 3.5–5.5)
ALT: 15 IU/L (ref 0–32)
AST: 24 IU/L (ref 0–40)
Albumin/Globulin Ratio: 2 (ref 1.2–2.2)
Alkaline Phosphatase: 62 IU/L (ref 39–117)
BILIRUBIN TOTAL: 0.6 mg/dL (ref 0.0–1.2)
BUN / CREAT RATIO: 16 (ref 9–23)
BUN: 13 mg/dL (ref 6–20)
CHLORIDE: 102 mmol/L (ref 96–106)
CO2: 21 mmol/L (ref 20–29)
Calcium: 10.3 mg/dL — ABNORMAL HIGH (ref 8.7–10.2)
Creatinine, Ser: 0.81 mg/dL (ref 0.57–1.00)
GFR, EST AFRICAN AMERICAN: 118 mL/min/{1.73_m2} (ref >59)
GFR, EST NON AFRICAN AMERICAN: 103 mL/min/{1.73_m2} (ref >59)
GLOBULIN, TOTAL: 2.5 g/dL (ref 1.5–4.5)
Glucose: 89 mg/dL (ref 65–99)
Potassium: 4.4 mmol/L (ref 3.5–5.2)
Sodium: 141 mmol/L (ref 134–144)
TOTAL PROTEIN: 7.4 g/dL (ref 6.0–8.5)

## 2017-11-06 LAB — LIPID PANEL
Chol/HDL Ratio: 2.6 ratio (ref 0.0–4.4)
Cholesterol, Total: 183 mg/dL (ref 100–199)
HDL: 70 mg/dL (ref >39)
LDL Calculated: 93 mg/dL (ref 0–99)
Triglycerides: 101 mg/dL (ref 0–149)
VLDL Cholesterol Cal: 20 mg/dL (ref 5–40)

## 2017-11-06 LAB — THYROID PANEL WITH TSH
Free Thyroxine Index: 2.2 (ref 1.2–4.9)
T3 UPTAKE RATIO: 26 % (ref 24–39)
T4, Total: 8.3 ug/dL (ref 4.5–12.0)
TSH: 1.96 u[IU]/mL (ref 0.450–4.500)

## 2017-12-28 DIAGNOSIS — J039 Acute tonsillitis, unspecified: Secondary | ICD-10-CM | POA: Diagnosis not present

## 2018-01-14 DIAGNOSIS — N911 Secondary amenorrhea: Secondary | ICD-10-CM | POA: Diagnosis not present

## 2018-01-14 DIAGNOSIS — Z3201 Encounter for pregnancy test, result positive: Secondary | ICD-10-CM | POA: Diagnosis not present

## 2018-01-28 DIAGNOSIS — Z3A10 10 weeks gestation of pregnancy: Secondary | ICD-10-CM | POA: Diagnosis not present

## 2018-01-28 DIAGNOSIS — O26891 Other specified pregnancy related conditions, first trimester: Secondary | ICD-10-CM | POA: Diagnosis not present

## 2018-01-28 DIAGNOSIS — Z3401 Encounter for supervision of normal first pregnancy, first trimester: Secondary | ICD-10-CM | POA: Diagnosis not present

## 2018-01-28 LAB — OB RESULTS CONSOLE ABO/RH: RH Type: POSITIVE

## 2018-01-28 LAB — OB RESULTS CONSOLE HIV ANTIBODY (ROUTINE TESTING): HIV: NONREACTIVE

## 2018-01-28 LAB — OB RESULTS CONSOLE HEPATITIS B SURFACE ANTIGEN: Hepatitis B Surface Ag: NEGATIVE

## 2018-01-28 LAB — OB RESULTS CONSOLE RUBELLA ANTIBODY, IGM: Rubella: IMMUNE

## 2018-01-28 LAB — OB RESULTS CONSOLE GC/CHLAMYDIA
Chlamydia: NEGATIVE
Gonorrhea: NEGATIVE

## 2018-01-28 LAB — OB RESULTS CONSOLE RPR: RPR: NONREACTIVE

## 2018-01-28 LAB — OB RESULTS CONSOLE ANTIBODY SCREEN: Antibody Screen: NEGATIVE

## 2018-02-24 ENCOUNTER — Telehealth: Payer: Self-pay | Admitting: *Deleted

## 2018-02-24 NOTE — Telephone Encounter (Signed)
Pt got flu shot through work [employer])

## 2018-03-03 DIAGNOSIS — Z3A14 14 weeks gestation of pregnancy: Secondary | ICD-10-CM | POA: Diagnosis not present

## 2018-03-03 DIAGNOSIS — O3680X0 Pregnancy with inconclusive fetal viability, not applicable or unspecified: Secondary | ICD-10-CM | POA: Diagnosis not present

## 2018-04-01 NOTE — L&D Delivery Note (Signed)
Delivery Note Pt pushed very well for for delivery.  At 10:13 PM a viable and healthy female was delivered via Vaginal, Spontaneous (Presentation: LOA; LOT  ).  APGAR: 9, 9; weight P .   Placenta status: delivered, intact.  Cord: 3V with the following complications: none.    Anesthesia: epidural    Episiotomy: None Lacerations: 2nd degree;Perineal; periurethral abrasion Suture Repair: 3.0 vicryl rapide Est. Blood Loss (mL): 290cc  Mom to postpartum.  Baby to Couplet care / Skin to Skin.  Arhianna Ebey Bovard-Stuckert 08/27/2018, 10:35 PM  Br/Tdap in PNC/RI/O+/Contra?  Desires circ for female infant - d/w parents r/b/a and process

## 2018-04-06 DIAGNOSIS — Z3A19 19 weeks gestation of pregnancy: Secondary | ICD-10-CM | POA: Diagnosis not present

## 2018-04-06 DIAGNOSIS — R309 Painful micturition, unspecified: Secondary | ICD-10-CM | POA: Diagnosis not present

## 2018-07-31 LAB — OB RESULTS CONSOLE GBS: GBS: NEGATIVE

## 2018-08-07 ENCOUNTER — Encounter (HOSPITAL_COMMUNITY): Payer: Self-pay | Admitting: *Deleted

## 2018-08-07 ENCOUNTER — Inpatient Hospital Stay (HOSPITAL_COMMUNITY)
Admission: AD | Admit: 2018-08-07 | Discharge: 2018-08-07 | Disposition: A | Discharge status: Home / Self Care | Payer: No Typology Code available for payment source | Attending: Obstetrics and Gynecology | Admitting: Obstetrics and Gynecology

## 2018-08-07 ENCOUNTER — Other Ambulatory Visit: Payer: Self-pay

## 2018-08-07 DIAGNOSIS — Z0371 Encounter for suspected problem with amniotic cavity and membrane ruled out: Secondary | ICD-10-CM | POA: Diagnosis not present

## 2018-08-07 DIAGNOSIS — N898 Other specified noninflammatory disorders of vagina: Secondary | ICD-10-CM | POA: Diagnosis present

## 2018-08-07 DIAGNOSIS — Z3A37 37 weeks gestation of pregnancy: Secondary | ICD-10-CM | POA: Insufficient documentation

## 2018-08-07 DIAGNOSIS — Z3689 Encounter for other specified antenatal screening: Secondary | ICD-10-CM

## 2018-08-07 LAB — AMNISURE RUPTURE OF MEMBRANE (ROM) NOT AT ARMC: Amnisure ROM: NEGATIVE

## 2018-08-07 LAB — POCT FERN TEST: POCT Fern Test: NEGATIVE

## 2018-08-07 NOTE — MAU Provider Note (Signed)
   None      S: Ms. Lori Porter is a 23 y.o. G1P0 at [redacted]w[redacted]d  who presents to MAU today complaining of leaking of fluid since 08/04/18. The leaking has been consistent, wetting her underwear and needing a pantyliner but has not required a pad. She denies vaginal bleeding. She denies contractions. She reports normal fetal movement.    O: BP 133/75 (BP Location: Right Arm)   Pulse 87   Temp 98.7 F (37.1 C) (Oral)   Resp 16   Ht 5\' 7"  (1.702 m)   Wt 77.3 kg   SpO2 99%   BMI 26.70 kg/m  GENERAL: Well-developed, well-nourished female in no acute distress.  HEAD: Normocephalic, atraumatic.  CHEST: Normal effort of breathing, regular heart rate ABDOMEN: Soft, nontender, gravid PELVIC: Normal external female genitalia. Vagina is pink and rugated. Cervix with normal contour, no lesions. Moderate amount milky white discharge.  Negative pooling.   Cervical exam:  Dilation: Fingertip Effacement (%): 30 Station: Ballotable Exam by:: Sharen Counter, CNM   Fetal Monitoring: Baseline: 150 Variability: moderate Accelerations: present Decelerations: none Contractions: irregular, mild  Results for orders placed or performed during the hospital encounter of 08/07/18 (from the past 24 hour(s))  Amnisure rupture of membrane (rom)not at [facility]     Status: None   Collection Time: 08/07/18  6:31 PM  Result Value Ref Range   Amnisure ROM NEGATIVE   POCT fern test     Status: None   Collection Time: 08/07/18  7:00 PM  Result Value Ref Range   POCT Fern Test Negative = intact amniotic membranes      A: SIUP at [redacted]w[redacted]d  Membranes intact  P: D/C home with labor precautions.  Keep scheduled appts in the office. Return to MAU with signs of labor or emergencies.  Hurshel Party, CNM 08/07/2018 7:12 PM

## 2018-08-07 NOTE — MAU Note (Signed)
For the last 3 days, has noted that her underwear has just been  Wet, no like d/c- more watery like urine.  Having more braxton hicks contractions and pressure down low. Less time she was checked was closed and 30%

## 2018-08-07 NOTE — Discharge Instructions (Signed)
Reasons to return to MAU at [facility]:  1.  Contractions are  5 minutes apart or less, each last 1 minute, these have been going on for 1-2 hours, and you cannot walk or talk during them 2.  You have a large gush of fluid, or a trickle of fluid that will not stop and you have to wear a pad 3.  You have bleeding that is bright red, heavier than spotting--like menstrual bleeding (spotting can be normal in early labor or after a check of your cervix) 4.  You do not feel the baby moving like he/she normally does  

## 2018-08-18 ENCOUNTER — Telehealth (HOSPITAL_COMMUNITY): Payer: Self-pay | Admitting: *Deleted

## 2018-08-18 ENCOUNTER — Encounter (HOSPITAL_COMMUNITY): Payer: Self-pay | Admitting: *Deleted

## 2018-08-18 NOTE — Telephone Encounter (Signed)
Preadmission screen  

## 2018-08-22 ENCOUNTER — Other Ambulatory Visit: Payer: Self-pay | Admitting: Obstetrics and Gynecology

## 2018-08-25 ENCOUNTER — Other Ambulatory Visit (HOSPITAL_COMMUNITY)
Admission: RE | Admit: 2018-08-25 | Discharge: 2018-08-25 | Disposition: A | Discharge status: Home / Self Care | Payer: No Typology Code available for payment source | Source: Ambulatory Visit | Attending: Obstetrics and Gynecology | Admitting: Obstetrics and Gynecology

## 2018-08-25 ENCOUNTER — Other Ambulatory Visit: Payer: Self-pay

## 2018-08-25 DIAGNOSIS — Z1159 Encounter for screening for other viral diseases: Secondary | ICD-10-CM | POA: Insufficient documentation

## 2018-08-25 NOTE — MAU Note (Signed)
Asymptomatic.  Some resistance when collecting swab, started to reach and turn, then relaxed and allowed to finish.

## 2018-08-26 ENCOUNTER — Other Ambulatory Visit: Payer: Self-pay | Admitting: Obstetrics and Gynecology

## 2018-08-26 ENCOUNTER — Other Ambulatory Visit (HOSPITAL_COMMUNITY): Payer: Self-pay | Admitting: *Deleted

## 2018-08-26 LAB — NOVEL CORONAVIRUS, NAA (HOSP ORDER, SEND-OUT TO REF LAB; TAT 18-24 HRS): SARS-CoV-2, NAA: NOT DETECTED

## 2018-08-26 NOTE — H&P (Signed)
Lori FreestoneHannah R Porter is a 23 y.o. female G1 at 6240+ presenting for IOL.  Cervix unfavorable, will induce w cytotec and foleybulb, d/w pt r/b/a and procedure.  Prenatal care relatively uncomplicated.  Platelets have been low, but stable 142, 146.  Pregnancy dated by LP c/w first trimester US.    OB History    Gravida  1   Para      Term      Preterm      AB      Living        SAB      TAB      Ectopic      Multiple      Live Births            G1 present  No abn pap, no STD  Past Medical History:  Diagnosis Date  . Constipation   . Gestational thrombocytopenia (HCC)   . Medical history non-contributory   . Scoliosis    mild-no intervention at present   Past Surgical History:  Procedure Laterality Date  . WISDOM TOOTH EXTRACTION Bilateral    Family History: CAD, HTN Social History:  reports that she has never smoked. She has never used smokeless tobacco. She reports that she does not drink alcohol or use drugs.married, US tech in [employer]  Meds PNV All PCN     Maternal Diabetes: No Genetic Screening: Declined Maternal Ultrasounds/Referrals: Normal Fetal Ultrasounds or other Referrals:  None Maternal Substance Abuse:  No Significant Maternal Medications:  None Significant Maternal Lab Results:  Lab values include: Group B Strep negative Other Comments:  None  Review of Systems  Constitutional: Negative.   HENT: Negative.   Eyes: Negative.   Respiratory: Negative.   Cardiovascular: Negative.   Gastrointestinal: Negative.   Genitourinary: Negative.   Musculoskeletal: Positive for back pain.  Skin: Negative.   Neurological: Negative.   Psychiatric/Behavioral: Negative.    Maternal Medical History:  Contractions: Frequency: irregular.   Perceived severity is moderate.    Fetal activity: Perceived fetal activity is normal.    Prenatal Complications - Diabetes: none.      There were no vitals taken for this visit. Maternal Exam:  Uterine  Assessment: Contraction frequency is irregular.   Abdomen: Patient reports no abdominal tenderness. Fundal height is appropriate for gestation.   Estimated fetal weight is 7-8#.   Fetal presentation: vertex  Introitus: Normal vulva. Normal vagina.    Physical Exam  Constitutional: She is oriented to person, place, and time. She appears well-developed and well-nourished.  HENT:  Head: Normocephalic and atraumatic.  Cardiovascular: Normal rate and regular rhythm.  Respiratory: Effort normal and breath sounds normal. No respiratory distress. She has no wheezes.  GI: Soft. Bowel sounds are normal. She exhibits no distension. There is no abdominal tenderness.  Genitourinary:    Vulva normal.   Musculoskeletal: Normal range of motion.  Neurological: She is alert and oriented to person, place, and time.  Skin: Skin is warm and dry.  Psychiatric: She has a normal mood and affect. Her behavior is normal.    Prenatal labs: ABO, Rh: O/Positive/-- (10/30 0000) Antibody: Negative (10/30 0000) Rubella: Immune (10/30 0000) RPR: Nonreactive (10/30 0000)  HBsAg: Negative (10/30 0000)  HIV: Non-reactive (10/30 0000)  GBS: Negative (05/01 0000)   Hgb 14.1/Plt 211, 148, 142/Ur Cx neg/GC neg/Chl neg/VI/Hgb electro WNL/glucola 86 Nl anat, ant plac, female  Assessment/Plan: 22yo G1P0 at 40+ for IOL Cytotec, Foley bulb, AROM and Pitocin to induce GBBS neg -  no prophylaxis Epidural prn exoect SVD   Dandrea Widdowson Bovard-Stuckert 08/26/2018, 9:40 PM

## 2018-08-27 ENCOUNTER — Inpatient Hospital Stay (HOSPITAL_COMMUNITY): Payer: No Typology Code available for payment source

## 2018-08-27 ENCOUNTER — Inpatient Hospital Stay (HOSPITAL_COMMUNITY): Payer: No Typology Code available for payment source | Admitting: Anesthesiology

## 2018-08-27 ENCOUNTER — Inpatient Hospital Stay (HOSPITAL_COMMUNITY)
Admission: AD | Admit: 2018-08-27 | Discharge: 2018-08-29 | DRG: 807 | Disposition: A | Discharge status: Home / Self Care | Payer: No Typology Code available for payment source | Attending: Obstetrics and Gynecology | Admitting: Obstetrics and Gynecology

## 2018-08-27 ENCOUNTER — Other Ambulatory Visit: Payer: Self-pay

## 2018-08-27 ENCOUNTER — Encounter (HOSPITAL_COMMUNITY): Payer: Self-pay | Admitting: General Practice

## 2018-08-27 DIAGNOSIS — Z3A40 40 weeks gestation of pregnancy: Secondary | ICD-10-CM

## 2018-08-27 DIAGNOSIS — Z3493 Encounter for supervision of normal pregnancy, unspecified, third trimester: Secondary | ICD-10-CM

## 2018-08-27 DIAGNOSIS — O26893 Other specified pregnancy related conditions, third trimester: Secondary | ICD-10-CM | POA: Diagnosis present

## 2018-08-27 LAB — CBC
HCT: 33.5 % — ABNORMAL LOW (ref 36.0–46.0)
HCT: 30.7 % — ABNORMAL LOW (ref 36.0–46.0)
Hemoglobin: 10.9 g/dL — ABNORMAL LOW (ref 12.0–15.0)
Hemoglobin: 10.3 g/dL — ABNORMAL LOW (ref 12.0–15.0)
MCH: 29 pg (ref 26.0–34.0)
MCH: 29.3 pg (ref 26.0–34.0)
MCHC: 32.5 g/dL (ref 30.0–36.0)
MCHC: 33.6 g/dL (ref 30.0–36.0)
MCV: 89.1 fL (ref 80.0–100.0)
MCV: 87.5 fL (ref 80.0–100.0)
Platelets: 123 10*3/uL — ABNORMAL LOW (ref 150–400)
Platelets: 124 10*3/uL — ABNORMAL LOW (ref 150–400)
RBC: 3.76 MIL/uL — ABNORMAL LOW (ref 3.87–5.11)
RBC: 3.51 MIL/uL — ABNORMAL LOW (ref 3.87–5.11)
RDW: 13.9 % (ref 11.5–15.5)
RDW: 13.6 % (ref 11.5–15.5)
WBC: 8.6 10*3/uL (ref 4.0–10.5)
WBC: 14.4 10*3/uL — ABNORMAL HIGH (ref 4.0–10.5)
nRBC: 0 % (ref 0.0–0.2)
nRBC: 0 % (ref 0.0–0.2)

## 2018-08-27 LAB — TYPE AND SCREEN
ABO/RH(D): O POS
Antibody Screen: NEGATIVE

## 2018-08-27 LAB — RPR: RPR Ser Ql: NONREACTIVE

## 2018-08-27 MED ORDER — PHENYLEPHRINE 40 MCG/ML (10ML) SYRINGE FOR IV PUSH (FOR BLOOD PRESSURE SUPPORT)
80.0000 ug | PREFILLED_SYRINGE | INTRAVENOUS | Status: DC | PRN
  Filled 2018-08-27: qty 10

## 2018-08-27 MED ORDER — FENTANYL-BUPIVACAINE-NACL 0.5-0.125-0.9 MG/250ML-% EP SOLN
12.0000 mL/h | EPIDURAL | Status: DC | PRN
  Filled 2018-08-27: qty 250

## 2018-08-27 MED ORDER — OXYTOCIN 40 UNITS IN NORMAL SALINE INFUSION - SIMPLE MED: 2.5000 [IU]/h | INTRAVENOUS | Status: DC

## 2018-08-27 MED ORDER — ONDANSETRON HCL 4 MG/2ML IJ SOLN: 4.0000 mg | Freq: Four times a day (QID) | INTRAMUSCULAR | Status: DC | PRN

## 2018-08-27 MED ORDER — MISOPROSTOL 50MCG HALF TABLET
50.0000 ug | ORAL_TABLET | Freq: Three times a day (TID) | ORAL | Status: DC | PRN
  Administered 2018-08-27: 50 ug via ORAL
  Filled 2018-08-27: qty 1

## 2018-08-27 MED ORDER — MISOPROSTOL 25 MCG QUARTER TABLET
25.0000 ug | ORAL_TABLET | Freq: Three times a day (TID) | ORAL | Status: DC | PRN
  Filled 2018-08-27: qty 1

## 2018-08-27 MED ORDER — TERBUTALINE SULFATE 1 MG/ML IJ SOLN: 0.2500 mg | Freq: Once | INTRAMUSCULAR | Status: DC | PRN

## 2018-08-27 MED ORDER — ACETAMINOPHEN 325 MG PO TABS: 650.0000 mg | ORAL_TABLET | ORAL | Status: DC | PRN

## 2018-08-27 MED ORDER — SODIUM CHLORIDE (PF) 0.9 % IJ SOLN
INTRAMUSCULAR | Status: DC | PRN
  Administered 2018-08-27: 14 mL/h via EPIDURAL

## 2018-08-27 MED ORDER — OXYCODONE-ACETAMINOPHEN 5-325 MG PO TABS: 1.0000 | ORAL_TABLET | ORAL | Status: DC | PRN

## 2018-08-27 MED ORDER — DIPHENHYDRAMINE HCL 50 MG/ML IJ SOLN: 12.5000 mg | INTRAMUSCULAR | Status: DC | PRN

## 2018-08-27 MED ORDER — LACTATED RINGERS IV SOLN: 500.0000 mL | INTRAVENOUS | Status: DC | PRN

## 2018-08-27 MED ORDER — EPHEDRINE 5 MG/ML INJ: 10.0000 mg | INTRAVENOUS | Status: DC | PRN

## 2018-08-27 MED ORDER — SOD CITRATE-CITRIC ACID 500-334 MG/5ML PO SOLN: 30.0000 mL | ORAL | Status: DC | PRN

## 2018-08-27 MED ORDER — LIDOCAINE HCL (PF) 1 % IJ SOLN
INTRAMUSCULAR | Status: DC | PRN
  Administered 2018-08-27 (×2): 5 mL via EPIDURAL

## 2018-08-27 MED ORDER — OXYTOCIN BOLUS FROM INFUSION
500.0000 mL | Freq: Once | INTRAVENOUS | Status: AC
  Administered 2018-08-27: 500 mL via INTRAVENOUS

## 2018-08-27 MED ORDER — LACTATED RINGERS IV SOLN: 500.0000 mL | Freq: Once | INTRAVENOUS | Status: DC

## 2018-08-27 MED ORDER — OXYTOCIN 40 UNITS IN NORMAL SALINE INFUSION - SIMPLE MED
1.0000 m[IU]/min | INTRAVENOUS | Status: DC
  Administered 2018-08-27: 2 m[IU]/min via INTRAVENOUS
  Filled 2018-08-27: qty 1000

## 2018-08-27 MED ORDER — LIDOCAINE HCL (PF) 1 % IJ SOLN: 30.0000 mL | INTRAMUSCULAR | Status: DC | PRN

## 2018-08-27 MED ORDER — LACTATED RINGERS AMNIOINFUSION
INTRAVENOUS | Status: DC
  Administered 2018-08-27: 20:00:00 via INTRAUTERINE

## 2018-08-27 MED ORDER — BUTORPHANOL TARTRATE 1 MG/ML IJ SOLN
1.0000 mg | INTRAMUSCULAR | Status: DC | PRN
  Administered 2018-08-27: 1 mg via INTRAVENOUS
  Filled 2018-08-27: qty 1

## 2018-08-27 MED ORDER — OXYCODONE-ACETAMINOPHEN 5-325 MG PO TABS: 2.0000 | ORAL_TABLET | ORAL | Status: DC | PRN

## 2018-08-27 MED ORDER — LACTATED RINGERS IV SOLN
INTRAVENOUS | Status: DC
  Administered 2018-08-27 (×3): via INTRAVENOUS

## 2018-08-27 NOTE — Anesthesia Procedure Notes (Signed)
Epidural Patient location during procedure: OB Start time: 08/27/2018 5:43 PM End time: 08/27/2018 5:55 PM  Staffing Anesthesiologist: Heather Roberts, MD Performed: anesthesiologist   Preanesthetic Checklist Completed: patient identified, site marked, pre-op evaluation, timeout performed, IV checked, risks and benefits discussed and monitors and equipment checked  Epidural Patient position: sitting Prep: DuraPrep Patient monitoring: heart rate, cardiac monitor, continuous pulse ox and blood pressure Approach: midline Location: L2-L3 Injection technique: LOR saline  Needle:  Needle type: Tuohy  Needle gauge: 17 G Needle length: 9 cm Needle insertion depth: 6 cm Catheter size: 20 Guage Catheter at skin depth: 12 cm Test dose: negative and Other  Assessment Events: blood not aspirated, injection not painful, no injection resistance and negative IV test  Additional Notes Informed consent obtained prior to proceeding including risk of failure, 1% risk of PDPH, risk of minor discomfort and bruising.  Discussed rare but serious complications including epidural abscess, permanent nerve injury, epidural hematoma.  Discussed alternatives to epidural analgesia and patient desires to proceed.  Timeout performed pre-procedure verifying patient name, procedure, and platelet count.  Patient tolerated procedure well.

## 2018-08-27 NOTE — Progress Notes (Signed)
10 Instruments 5 9*9 5 4*18 2 Injectables  

## 2018-08-27 NOTE — Progress Notes (Signed)
Patient ID: Lori Porter, female   DOB: 12/12/1995, 23 y.o.   MRN: [id number]   Comfortable with epidural  AFVSS gen NAD FHTs 120-150, some decel after epidural w good recovery, moderate variability throughout.  Some variable/earlies toco q 2-51min  IUPC placed, w/o diff/comp  Continue IOL

## 2018-08-27 NOTE — Progress Notes (Signed)
Patient ID: Lori Porter, female   DOB: 16-Oct-1995, 23 y.o.   MRN: [id number]   D/W pt placing foley bulb for IOL  gen NAD FHTs 140-145, mod var, + accels, category 1 toco q , feels some  Foley bulb placed w/o diff, SROM during foley bulb placement.  Clear fluid  Expect SVD Will give oral cytotec when able

## 2018-08-27 NOTE — Progress Notes (Signed)
Patient ID: Lori Porter, female   DOB: 1996-01-09, 23 y.o.   MRN: [id number]   H&P reviewed, no changes Reviewed w pt POC  AFVSS gen NAD  FHTs 140's, mod var, + accels, category 1 tocoq , irr  SVE FT  Will place foley bulb and cytotec for IOL

## 2018-08-27 NOTE — Progress Notes (Signed)
Patient ID: Lori Porter, female   DOB: [birth year], 23 y.o.   MRN: [id number]   Feeling ctx, pressure.  Handling it do well  AFVSS gen NAD FHTs 130's, mod var, +accels, category 1 toco irr  SVE 2cm checked aroung foley bulb, has also received cytotec.    Continue IOL

## 2018-08-27 NOTE — Anesthesia Preprocedure Evaluation (Signed)
Anesthesia Evaluation  Patient identified by MRN, date of birth, ID band Patient awake    Reviewed: Allergy & Precautions, NPO status , Patient's Chart, lab work & pertinent test results  Airway Mallampati: II  TM Distance: >3 FB Neck ROM: Full    Dental  (+) Dental Advisory Given   Pulmonary neg pulmonary ROS,    Pulmonary exam normal        Cardiovascular negative cardio ROS Normal cardiovascular exam     Neuro/Psych negative neurological ROS  negative psych ROS   GI/Hepatic negative GI ROS, Neg liver ROS,   Endo/Other  negative endocrine ROS  Renal/GU negative Renal ROS  negative genitourinary   Musculoskeletal negative musculoskeletal ROS (+)   Abdominal   Peds negative pediatric ROS (+)  Hematology negative hematology ROS (+)   Anesthesia Other Findings   Reproductive/Obstetrics (+) Pregnancy                             Anesthesia Physical Anesthesia Plan  ASA: II  Anesthesia Plan: Epidural   Post-op Pain Management:    Induction:   PONV Risk Score and Plan:   Airway Management Planned:   Additional Equipment:   Intra-op Plan:   Post-operative Plan:   Informed Consent: I have reviewed the patients History and Physical, chart, labs and discussed the procedure including the risks, benefits and alternatives for the proposed anesthesia with the patient or authorized representative who has indicated his/her understanding and acceptance.     Dental advisory given  Plan Discussed with: Anesthesiologist  Anesthesia Plan Comments:         Anesthesia Quick Evaluation

## 2018-08-28 LAB — CBC
HCT: 31.7 % — ABNORMAL LOW (ref 36.0–46.0)
Hemoglobin: 10.5 g/dL — ABNORMAL LOW (ref 12.0–15.0)
MCH: 29.2 pg (ref 26.0–34.0)
MCHC: 33.1 g/dL (ref 30.0–36.0)
MCV: 88.3 fL (ref 80.0–100.0)
Platelets: 130 10*3/uL — ABNORMAL LOW (ref 150–400)
RBC: 3.59 MIL/uL — ABNORMAL LOW (ref 3.87–5.11)
RDW: 13.4 % (ref 11.5–15.5)
WBC: 14 10*3/uL — ABNORMAL HIGH (ref 4.0–10.5)
nRBC: 0 % (ref 0.0–0.2)

## 2018-08-28 LAB — ABO/RH: ABO/RH(D): O POS

## 2018-08-28 MED ORDER — OXYCODONE HCL 5 MG PO TABS: 5.0000 mg | ORAL_TABLET | ORAL | Status: DC | PRN

## 2018-08-28 MED ORDER — LACTATED RINGERS IV SOLN: INTRAVENOUS | Status: DC

## 2018-08-28 MED ORDER — IBUPROFEN 600 MG PO TABS
600.0000 mg | ORAL_TABLET | Freq: Four times a day (QID) | ORAL | Status: DC
  Administered 2018-08-28 – 2018-08-29 (×6): 600 mg via ORAL
  Filled 2018-08-28 (×6): qty 1

## 2018-08-28 MED ORDER — DIPHENHYDRAMINE HCL 25 MG PO CAPS: 25.0000 mg | ORAL_CAPSULE | Freq: Four times a day (QID) | ORAL | Status: DC | PRN

## 2018-08-28 MED ORDER — ONDANSETRON HCL 4 MG/2ML IJ SOLN: 4.0000 mg | INTRAMUSCULAR | Status: DC | PRN

## 2018-08-28 MED ORDER — PRENATAL MULTIVITAMIN CH
1.0000 | ORAL_TABLET | Freq: Every day | ORAL | Status: DC
  Administered 2018-08-28 – 2018-08-29 (×2): 1 via ORAL
  Filled 2018-08-28 (×2): qty 1

## 2018-08-28 MED ORDER — ZOLPIDEM TARTRATE 5 MG PO TABS: 5.0000 mg | ORAL_TABLET | Freq: Every evening | ORAL | Status: DC | PRN

## 2018-08-28 MED ORDER — ONDANSETRON HCL 4 MG PO TABS: 4.0000 mg | ORAL_TABLET | ORAL | Status: DC | PRN

## 2018-08-28 MED ORDER — BENZOCAINE-MENTHOL 20-0.5 % EX AERO
1.0000 "application " | INHALATION_SPRAY | CUTANEOUS | Status: DC | PRN
  Filled 2018-08-28: qty 56

## 2018-08-28 MED ORDER — SIMETHICONE 80 MG PO CHEW: 80.0000 mg | CHEWABLE_TABLET | ORAL | Status: DC | PRN

## 2018-08-28 MED ORDER — DIBUCAINE (PERIANAL) 1 % EX OINT: 1.0000 "application " | TOPICAL_OINTMENT | CUTANEOUS | Status: DC | PRN

## 2018-08-28 MED ORDER — OXYCODONE HCL 5 MG PO TABS: 10.0000 mg | ORAL_TABLET | ORAL | Status: DC | PRN

## 2018-08-28 MED ORDER — WITCH HAZEL-GLYCERIN EX PADS: 1.0000 "application " | MEDICATED_PAD | CUTANEOUS | Status: DC | PRN

## 2018-08-28 MED ORDER — COCONUT OIL OIL
1.0000 "application " | TOPICAL_OIL | Status: DC | PRN
  Administered 2018-08-29: 1 via TOPICAL

## 2018-08-28 MED ORDER — ACETAMINOPHEN 325 MG PO TABS: 650.0000 mg | ORAL_TABLET | ORAL | Status: DC | PRN

## 2018-08-28 MED ORDER — SENNOSIDES-DOCUSATE SODIUM 8.6-50 MG PO TABS
2.0000 | ORAL_TABLET | ORAL | Status: DC
  Administered 2018-08-28 – 2018-08-29 (×2): 2 via ORAL
  Filled 2018-08-28 (×2): qty 2

## 2018-08-28 NOTE — Discharge Summary (Signed)
OB Discharge Summary     Patient Name: Lori Porter DOB: 11/17/1995 MRN: [id number]  Date of admission: 08/27/2018 Delivering MD: Sherian ReinBOVARD-STUCKERT, JODY   Date of discharge: 08/29/2018  Admitting diagnosis: pregnancy Intrauterine pregnancy: 4662w1d     Secondary diagnosis:  Principal Problem:   SVD (spontaneous vaginal delivery) Active Problems:   Normal pregnancy in third trimester  Additional problems: none     Discharge diagnosis: Term Pregnancy Delivered                                                                                                Post partum procedures:none  Augmentation: AROM, Pitocin, Cytotec and Foley Balloon  Complications: None  Hospital course:  Induction of Labor With Vaginal Delivery   23 y.o. yo G1P1001 at 1962w1d was admitted to the hospital 08/27/2018 for induction of labor.  Indication for induction: Favorable cervix at term.  Patient had an uncomplicated labor course as follows: Membrane Rupture Time/Date: 9:15 AM ,08/27/2018   Intrapartum Procedures: Episiotomy: None [1]                                         Lacerations:  2nd degree [3];Perineal [11]  Patient had delivery of a Viable infant.  Information for the patient's newborn:  Leda MinBazemore, Boy Rabecca [[id number]][[id number]]  Delivery Method: Vaginal, Spontaneous(Filed from Delivery Summary)   08/27/2018  Details of delivery can be found in separate delivery note.  Patient had a routine postpartum course. Patient is discharged home 08/29/18.  Physical exam  Vitals:   08/28/18 0939 08/28/18 1400 08/28/18 2156 08/29/18 0524  BP: 111/69 104/61 (!) 106/58 108/76  Pulse: 79 83 81 78  Resp: 20 18 18 18   Temp: 98.6 F (37 C) 98.5 F (36.9 C) 98.9 F (37.2 C) 98.4 F (36.9 C)  TempSrc: Oral Oral Oral Oral  SpO2: 98% 98%    Weight:      Height:       General: alert and cooperative Lochia: appropriate Uterine Fundus: firm  Labs: Lab Results  Component Value Date   WBC 14.0 (H) 08/28/2018    HGB 10.5 (L) 08/28/2018   HCT 31.7 (L) 08/28/2018   MCV 88.3 08/28/2018   PLT 130 (L) 08/28/2018   CMP Latest Ref Rng & Units 11/05/2017  Glucose 65 - 99 mg/dL 89  BUN 6 - 20 mg/dL 13  Creatinine 7.820.57 - 9.561.00 mg/dL 2.130.81  Sodium 086134 - 578144 mmol/L 141  Potassium 3.5 - 5.2 mmol/L 4.4  Chloride 96 - 106 mmol/L 102  CO2 20 - 29 mmol/L 21  Calcium 8.7 - 10.2 mg/dL 10.3(H)  Total Protein 6.0 - 8.5 g/dL 7.4  Total Bilirubin 0.0 - 1.2 mg/dL 0.6  Alkaline Phos 39 - 117 IU/L 62  AST 0 - 40 IU/L 24  ALT 0 - 32 IU/L 15    Discharge instruction: per After Visit Summary and "Baby and Me Booklet".  After visit meds:  Allergies as of 08/29/2018      Reactions  Oseltamivir Phosphate Other (See Comments)   Hallucinations   Penicillins Hives   Amoxicillin Rash   Peanuts [nuts] Rash      Medication List    TAKE these medications   acetaminophen 325 MG tablet Commonly known as:  Tylenol Take 2 tablets (650 mg total) by mouth every 4 (four) hours as needed (for pain scale < 4).   ibuprofen 600 MG tablet Commonly known as:  ADVIL Take 1 tablet (600 mg total) by mouth every 6 (six) hours.   prenatal multivitamin Tabs tablet Take 1 tablet by mouth daily at 12 noon.       Diet: routine diet  Activity: Advance as tolerated. Pelvic rest for 6 weeks.   Outpatient follow up:6 weeks Follow up Appt:No future appointments. Follow up Visit:No follow-ups on file.  Postpartum contraception: Undecided  Newborn Data: Live born female  Birth Weight: 7 lb 3 oz (3260 g) APGAR: 9, 9  Newborn Delivery   Birth date/time:  08/27/2018 22:13:00 Delivery type:  Vaginal, Spontaneous     Baby Feeding: Breast Disposition:home with mother   08/29/2018 Oliver Pila, MD

## 2018-08-28 NOTE — Anesthesia Postprocedure Evaluation (Signed)
Anesthesia Post Note  Patient: Lori Porter  Procedure(s) Performed: AN AD HOC LABOR EPIDURAL     Patient location during evaluation: Mother Baby Anesthesia Type: Epidural Level of consciousness: awake, awake and alert and oriented Pain management: pain level controlled Vital Signs Assessment: post-procedure vital signs reviewed and stable Respiratory status: spontaneous breathing and nonlabored ventilation Postop Assessment: able to ambulate, no headache and no backache Anesthetic complications: no    Last Vitals:  Vitals:   08/28/18 0135 08/28/18 0525  BP: 113/71 132/77  Pulse: 87 90  Resp: 18 18  Temp: 37.2 C 36.7 C  SpO2: 100% 99%    Last Pain:  Vitals:   08/28/18 0525  TempSrc: Oral  PainSc: 5    Pain Goal:                   Jennelle Human

## 2018-08-28 NOTE — Lactation Note (Signed)
This note was copied from a baby's chart. Lactation Consultation Note  Patient Name: Lori Porter QIONG'E Date: 08/28/2018 Reason for consult: Follow-up assessment;1st time breastfeeding;Primapara;Term  2013 - 2028 - I visited [name] to check on her progress with breast feeding. She states that her son, [name], is latching much better now. She  breast fed him for 45 minutes prior to my entry. Mom reports that [facility] latches best when using a size 20 mm nipple shield.  I reviewed positioning in the football hold at the breast. [name] showed no hunger cues. I discussed day 2 infant feeding patterns and nighttime cluster feeding. We also discussed out put expectations. I had mom repeat back hand expression. Her colostrum readily expresses. Mom has been hand expressing and feeding her colostrum to baby today when he would not latch earlier.  Mom's nipples are slightly short with slightly puffy areolas. Her breast tissue is pliable. Mom states that her areolas are always a little puffy (not due to delivery).  Mom will call for help this evening as needed. Follow up tomorrow and review baby's latch.   Maternal Data Formula Feeding for Exclusion: No Has patient been taught Hand Expression?: Yes Does the patient have breastfeeding experience prior to this delivery?: No  Feeding Feeding Type: Other (comment)(states that baby fed 45 minutes prior to entry)  Interventions Interventions: Breast feeding basics reviewed  Lactation Tools Discussed/Used Tools: Nipple Shields Nipple shield size: 20   Consult Status Consult Status: Follow-up Date: 08/29/18 Follow-up type: In-patient    Walker Shadow 08/28/2018, 8:44 PM

## 2018-08-28 NOTE — Progress Notes (Signed)
Post Partum Day 1 Subjective: no complaints, up ad lib and tolerating PO  Working on breastfeeding  Objective: Blood pressure 111/69, pulse 79, temperature 98.6 F (37 C), temperature source Oral, resp. rate 20, height 5\' 7"  (1.702 m), weight 78.5 kg, SpO2 98 %, unknown if currently breastfeeding.  Physical Exam:  General: alert and cooperative Lochia: appropriate Uterine Fundus: firm   Recent Labs    08/27/18 2257 08/28/18 0542  HGB 10.3* 10.5*  HCT 30.7* 31.7*    Assessment/Plan: Plan for discharge tomorrow Plan  circumcision tomorrow to allow baby to work on nursing, d/w pt process  LOS: 1 day   Lori Porter 08/28/2018, 12:26 PM

## 2018-08-28 NOTE — Lactation Note (Signed)
This note was copied from a baby's chart. Lactation Consultation Note Baby 8 hrs old. Mom states baby isn't interested in BF.  Baby had 1 spit up.  Mom has cone shaped breast, puffy areolas,semi flat nipple. breast heavy. Breast massage and hand expression w/3 ml colostrum collected. Mom stated she has been leaking since [redacted] wks gestation.  W/stimulation nipples do not stay slightly erect long.  Baby has recessed chin. Discussed chin tug, positioning, support, breast massage, STS, I&O, milk storage, supply and demand. Newborn behavior and feeding habits discussed.  Baby sleepy. He did take 3 ml colostrum via spoon well. Encouraged mom to spoon feed if will not latch. Gave mom shells and hand pump for pre-pumping.  Encouraged to call for assistance or concerns. Lactation brochure given.  Patient Name: Lori Porter [id number] Date: 08/28/2018 Reason for consult: Initial assessment;1st time breastfeeding;Term   Maternal Data Has patient been taught Hand Expression?: Yes Does the patient have breastfeeding experience prior to this delivery?: No  Feeding Feeding Type: Breast Milk  LATCH Score Latch: Too sleepy or reluctant, no latch achieved, no sucking elicited.  Audible Swallowing: None  Type of Nipple: Flat  Comfort (Breast/Nipple): Soft / non-tender        Interventions Interventions: Breast feeding basics reviewed;Breast compression;Hand pump;Skin to skin;Support pillows;Breast massage;Position options;Hand express;Expressed milk;Pre-pump if needed;Shells  Lactation Tools Discussed/Used Tools: Pump;Shells Shell Type: Inverted Breast pump type: Manual WIC Program: No Pump Review: Setup, frequency, and cleaning;Milk Storage Initiated by:: Peri Jefferson RN IBCLC Date initiated:: 08/28/18   Consult Status Consult Status: Follow-up Date: 08/28/18(in pm) Follow-up type: In-patient    Charyl Dancer 08/28/2018, 6:44 AM

## 2018-08-29 MED ORDER — IBUPROFEN 600 MG PO TABS: 600.0000 mg | ORAL_TABLET | Freq: Four times a day (QID) | ORAL | 0 refills | Status: DC

## 2018-08-29 MED ORDER — ACETAMINOPHEN 325 MG PO TABS: 650.0000 mg | ORAL_TABLET | ORAL | 0 refills | Status: DC | PRN

## 2018-08-29 NOTE — Lactation Note (Signed)
This note was copied from a baby's chart. Lactation Consultation Note: Mother reports that infant has been sleeping since circumcision.  Assist mother with STS and rousing infant .   Mother has bilateral scabbing. She has  tiny nipples. She was fit with a #20 NS. Mother reports that she feels pinching pain when infant is feeding. She is unaware if infant is swallowing.   Reviewed application of the nipple shield. When infant roused he was very gaggy and spitting small amt of mucus.  Infant not showing feeding cues. Used this time for lots of teaching.  Assist mother with hand expression. Infant was fed 2 ml of ebm with a spoon.   Infant placed back to the breast without the nipple shield , infant latched on using the tea-cup hold. Infant observed with good strong tugs for several mins. Mother reports that she has no pain with latch.    Plan of Care  Suggested that  mother  offer breast without the shield first if unable to latch without pain then use the NS. Mother advised  to flange infants lips for wide gape.    Mother was given comfort gels and a hand pump with instructions to pump for 15 mins on each breast.  Mother was given supplemental guidelines for ebm/formula. Advised to supplement infant after each feeding. Mother has curved tip syringe for supplementing method.   Mother reports that she has ordered her pump from her insurance company, but it hasn't arrived yet.   Advised mother to do frequent STS and feed infant 8-12 times in 24 hours.  Mother to pump after each feeding for 15 mins on each breast. Continue to cue base feed and discussed cluster feeding.   Mother is aware of available LC OP dept. She would like follow up.  I did send a message for her to get a call.  Discussed treatment and prevention of engorgement.   Mother has LC number for questions or concerns.     Patient Name: Lori Porter [id number] Date: 08/29/2018 Reason for consult: Follow-up  assessment   Maternal Data    Feeding Feeding Type: Breast Fed  LATCH Score Latch: Repeated attempts needed to sustain latch, nipple held in mouth throughout feeding, stimulation needed to elicit sucking reflex.  Audible Swallowing: A few with stimulation  Type of Nipple: Everted at rest and after stimulation  Comfort (Breast/Nipple): Filling, red/small blisters or bruises, mild/mod discomfort  Hold (Positioning): Assistance needed to correctly position infant at breast and maintain latch.  LATCH Score: 6  Interventions Interventions: Assisted with latch;Skin to skin;Hand express;Pre-pump if needed;Breast compression;Adjust position;Support pillows;Position options;Expressed milk;Comfort gels;Hand pump  Lactation Tools Discussed/Used Tools: (without the nipple shield) Nipple shield size: 20   Consult Status Consult Status: Follow-up Date: 08/30/18 Follow-up type: In-patient    Stevan [facility] 08/29/2018, 1:18 PM

## 2018-08-29 NOTE — Progress Notes (Signed)
Post Partum Day 2 Subjective: no complaints and up ad lib  Objective: Blood pressure 108/76, pulse 78, temperature 98.4 F (36.9 C), temperature source Oral, resp. rate 18, height 5\' 7"  (1.702 m), weight 78.5 kg, SpO2 98 %, unknown if currently breastfeeding.  Physical Exam:  General: alert and cooperative Lochia: appropriate Uterine Fundus: firm   Recent Labs    08/27/18 2257 08/28/18 0542  HGB 10.3* 10.5*  HCT 30.7* 31.7*    Assessment/Plan: Discharge home   LOS: 2 days   Oliver Pila 08/29/2018, 9:20 AM

## 2018-10-12 ENCOUNTER — Encounter: Payer: Self-pay | Admitting: Physician Assistant

## 2018-10-12 ENCOUNTER — Telehealth: Payer: No Typology Code available for payment source | Admitting: Physician Assistant

## 2018-10-12 DIAGNOSIS — J029 Acute pharyngitis, unspecified: Secondary | ICD-10-CM

## 2018-10-12 MED ORDER — AZITHROMYCIN 250 MG PO TABS: ORAL_TABLET | ORAL | 0 refills | Status: DC

## 2018-10-12 NOTE — Progress Notes (Signed)
We are sorry that you are not feeling well.  Here is how we plan to help!  Your symptoms indicate a likely viral infection (Pharyngitis).   Pharyngitis is inflammation in the back of the throat which can cause a sore throat, scratchiness and sometimes difficulty swallowing.   Pharyngitis is typically caused by a respiratory virus and will just run its course.  Please keep in mind that your symptoms could last up to 10 days.  For throat pain, we recommend over the counter oral pain relief medications such as acetaminophen or aspirin, or anti-inflammatory medications such as ibuprofen or naproxen sodium.  Topical treatments such as oral throat lozenges or sprays may be used as needed.  Avoid close contact with loved ones, especially the very young and elderly.  Remember to wash your hands thoroughly throughout the day as this is the number one way to prevent the spread of infection and wipe down door knobs and counters with disinfectant.  Antibiotics should not be used to treat conditions that we suspect are caused by viruses like the virus that causes the common cold or flu. However, some people can have Strep with atypical symptoms.  Due to severity of your symptoms, I prescribed the antibiotic Azithromycin 250 mg, take two pill on the first day, then take one pill daily days 2-5. Start the prescribed antibiotic if your symptoms persist.    You may need formal testing in clinic or office to confirm if your symptoms continue or worsen.  Providers prescribe antibiotics to treat infections caused by bacteria. Antibiotics are very powerful in treating bacterial infections when they are used properly.  To maintain their effectiveness, they should be used only when necessary.  Overuse of antibiotics has resulted in the development of super bugs that are resistant to treatment!    Home Care:  Only take medications as instructed by your medical team.  Do not drink alcohol while taking these medications.  A  steam or ultrasonic humidifier can help congestion.  You can place a towel over your head and breathe in the steam from hot water coming from a faucet.  Avoid close contacts especially the very young and the elderly.  Cover your mouth when you cough or sneeze.  Always remember to wash your hands.  Get Help Right Away If:  You develop worsening fever or throat pain.  You develop a severe head ache or visual changes.  Your symptoms persist after you have completed your treatment plan.  Make sure you  Understand these instructions.  Will watch your condition.  Will get help right away if you are not doing well or get worse.  Your e-visit answers were reviewed by a board certified advanced clinical practitioner to complete your personal care plan.  Depending on the condition, your plan could have included both over the counter or prescription medications.  If there is a problem please reply  once you have received a response from your provider.  Your safety is important to us.  If you have drug allergies check your prescription carefully.    You can use MyChart to ask questions about todays visit, request a non-urgent call back, or ask for a work or school excuse for 24 hours related to this e-Visit. If it has been greater than 24 hours you will need to follow up with your provider, or enter a new e-Visit to address those concerns.  You will get an e-mail in the next two days asking about your experience.  I  hope that your e-visit has been valuable and will speed your recovery. Thank you for using e-visits.   I spent 5-10 minutes on review and completion of this note- Lacy Duverney [facility]

## 2018-12-02 ENCOUNTER — Other Ambulatory Visit: Payer: Self-pay

## 2018-12-02 DIAGNOSIS — Z20822 Contact with and (suspected) exposure to covid-19: Secondary | ICD-10-CM

## 2018-12-03 LAB — NOVEL CORONAVIRUS, NAA: SARS-CoV-2, NAA: NOT DETECTED

## 2019-04-02 NOTE — L&D Delivery Note (Signed)
DELIVERY NOTE I arrived into patient room immediately after delivery of baby as faculty CNM passed viable female infant to mother for skin-to-skin. Loose nuchal reported, delivered through, ROA. Patient unmedicated. Cord was then clamped and cut by FOB. Cord blood obtained, 3VC. Baby had a vigorous spontaneous cry noted. Placenta then delivered at 1445 intact. Fundal massage performed and pitocin given IM as patient lost IV during delivery process. Fundus firm. The following lacerations were noted: BL labial, 1st degree perineal. Repaired in routine fashion with 4-0 monocryl and 2-0 vicryl, respectively. Mother and baby stable. Counts correct   Infant time: 1439 Gender: female Placenta time: 1445 Apgars: 9/9 EBL 200cc Weight: pending skin-to-skin

## 2019-06-30 ENCOUNTER — Telehealth: Payer: No Typology Code available for payment source | Admitting: Family

## 2019-06-30 DIAGNOSIS — R399 Unspecified symptoms and signs involving the genitourinary system: Secondary | ICD-10-CM | POA: Diagnosis not present

## 2019-06-30 MED ORDER — NITROFURANTOIN MONOHYD MACRO 100 MG PO CAPS: 100.0000 mg | ORAL_CAPSULE | Freq: Two times a day (BID) | ORAL | 0 refills | Status: DC

## 2019-06-30 NOTE — Progress Notes (Signed)
We are sorry that you are not feeling well.  Here is how we plan to help! ° °Based on what you shared with me it looks like you most likely have a simple urinary tract infection. ° °A UTI (Urinary Tract Infection) is a bacterial infection of the bladder. ° °Most cases of urinary tract infections are simple to treat but a key part of your care is to encourage you to drink plenty of fluids and watch your symptoms carefully. ° °I have prescribed MacroBid 100 mg twice a day for 5 days.  Your symptoms should gradually improve. Call us if the burning in your urine worsens, you develop worsening fever, back pain or pelvic pain or if your symptoms do not resolve after completing the antibiotic. ° °Urinary tract infections can be prevented by drinking plenty of water to keep your body hydrated.  Also be sure when you wipe, wipe from front to back and don't hold it in!  If possible, empty your bladder every 4 hours. ° °Your e-visit answers were reviewed by a board certified advanced clinical practitioner to complete your personal care plan.  Depending on the condition, your plan could have included both over the counter or prescription medications. ° °If there is a problem please reply  once you have received a response from your provider. ° °Your safety is important to us.  If you have drug allergies check your prescription carefully.   ° °You can use MyChart to ask questions about today’s visit, request a non-urgent call back, or ask for a work or school excuse for 24 hours related to this e-Visit. If it has been greater than 24 hours you will need to follow up with your provider, or enter a new e-Visit to address those concerns. ° ° °You will get an e-mail in the next two days asking about your experience.  I hope that your e-visit has been valuable and will speed your recovery. Thank you for using e-visits. ° °Approximately 5 minutes was spent documenting and reviewing patient's chart.  ° ° ° °

## 2019-08-25 LAB — OB RESULTS CONSOLE VARICELLA ZOSTER ANTIBODY, IGG: Varicella: IMMUNE

## 2019-08-25 LAB — OB RESULTS CONSOLE RUBELLA ANTIBODY, IGM: Rubella: IMMUNE

## 2019-08-25 LAB — OB RESULTS CONSOLE HIV ANTIBODY (ROUTINE TESTING): HIV: NONREACTIVE

## 2019-08-25 LAB — OB RESULTS CONSOLE HEPATITIS B SURFACE ANTIGEN: Hepatitis B Surface Ag: NEGATIVE

## 2019-08-26 LAB — OB RESULTS CONSOLE GC/CHLAMYDIA
Chlamydia: NEGATIVE
Gonorrhea: NEGATIVE

## 2019-12-03 ENCOUNTER — Encounter (HOSPITAL_COMMUNITY): Payer: Self-pay | Admitting: *Deleted

## 2019-12-03 ENCOUNTER — Inpatient Hospital Stay (HOSPITAL_COMMUNITY)
Admission: AD | Admit: 2019-12-03 | Discharge: 2019-12-03 | Disposition: A | Discharge status: Home / Self Care | Payer: No Typology Code available for payment source | Source: Ambulatory Visit | Attending: Obstetrics and Gynecology | Admitting: Obstetrics and Gynecology

## 2019-12-03 ENCOUNTER — Other Ambulatory Visit: Payer: Self-pay

## 2019-12-03 DIAGNOSIS — Z880 Allergy status to penicillin: Secondary | ICD-10-CM | POA: Diagnosis not present

## 2019-12-03 DIAGNOSIS — Z791 Long term (current) use of non-steroidal anti-inflammatories (NSAID): Secondary | ICD-10-CM | POA: Diagnosis not present

## 2019-12-03 DIAGNOSIS — M419 Scoliosis, unspecified: Secondary | ICD-10-CM | POA: Diagnosis not present

## 2019-12-03 DIAGNOSIS — B373 Candidiasis of vulva and vagina: Secondary | ICD-10-CM | POA: Diagnosis not present

## 2019-12-03 DIAGNOSIS — O98812 Other maternal infectious and parasitic diseases complicating pregnancy, second trimester: Secondary | ICD-10-CM

## 2019-12-03 DIAGNOSIS — Z3492 Encounter for supervision of normal pregnancy, unspecified, second trimester: Secondary | ICD-10-CM

## 2019-12-03 DIAGNOSIS — Z79899 Other long term (current) drug therapy: Secondary | ICD-10-CM | POA: Diagnosis not present

## 2019-12-03 DIAGNOSIS — Z3A24 24 weeks gestation of pregnancy: Secondary | ICD-10-CM

## 2019-12-03 LAB — AMNISURE RUPTURE OF MEMBRANE (ROM) NOT AT ARMC: Amnisure ROM: NEGATIVE

## 2019-12-03 LAB — URINALYSIS, ROUTINE W REFLEX MICROSCOPIC
Bilirubin Urine: NEGATIVE
Glucose, UA: NEGATIVE mg/dL
Hgb urine dipstick: NEGATIVE
Ketones, ur: NEGATIVE mg/dL
Leukocytes,Ua: NEGATIVE
Nitrite: NEGATIVE
Protein, ur: NEGATIVE mg/dL
Specific Gravity, Urine: 1.003 — ABNORMAL LOW (ref 1.005–1.030)
pH: 7 (ref 5.0–8.0)

## 2019-12-03 MED ORDER — TERCONAZOLE 0.4 % VA CREA: 1.0000 | TOPICAL_CREAM | Freq: Every day | VAGINAL | 0 refills | Status: DC

## 2019-12-03 NOTE — Discharge Instructions (Signed)

## 2019-12-03 NOTE — MAU Note (Signed)
Presents with c/o gush of fluid @ 1100 this morning.  States pants & underwear were both wet.  States been having abdominal cramping, mild.  Denies VB.  Endorses +FM.

## 2019-12-03 NOTE — MAU Provider Note (Signed)
History     CSN: [id number]  Arrival date and time: 12/03/19 1059   First Provider Initiated Contact with Patient 12/03/19 1159      Chief Complaint  Patient presents with  . Rupture of Membranes   HPI Lori Porter is a 24 y.o. G2P1001 at [redacted]w[redacted]d who presents to MAU with chief complaint of leaking of fluid. Patient states she was seated, performing an ultrasound on a patient this morning when she felt spontaneous leaking of fluid which saturated her underwear and pants.   Patient endorses history of urinary incontinence this pregnancy but states it has consistently been associated with pelvic floor stress e.g. coughing, sneezing, squatting.   She denies pain, vaginal bleeding, decreased fetal movement, fever, falls, or recent illness.   Patient receives care with [facility].  OB History    Gravida  2   Para  1   Term  1   Preterm      AB      Living  1     SAB      TAB      Ectopic      Multiple  0   Live Births  1           Past Medical History:  Diagnosis Date  . Constipation   . Gestational thrombocytopenia (HCC)   . Medical history non-contributory   . Scoliosis    mild-no intervention at present  . SVD (spontaneous vaginal delivery) 08/27/2018    Past Surgical History:  Procedure Laterality Date  . WISDOM TOOTH EXTRACTION Bilateral     Family History  Problem Relation Age of Onset  . Hirschsprung's disease Neg Hx     Social History   Tobacco Use  . Smoking status: Never Smoker  . Smokeless tobacco: Never Used  Vaping Use  . Vaping Use: Never used  Substance Use Topics  . Alcohol use: No  . Drug use: No    Allergies:  Allergies  Allergen Reactions  . Oseltamivir Phosphate Other (See Comments)    Hallucinations  . Penicillins Hives  . Amoxicillin Rash  . Peanuts [Nuts] Rash    Medications Prior to Admission  Medication Sig Dispense Refill Last Dose  . Ascorbic Acid (VITAMIN C) 100 MG tablet Take 100 mg by mouth  daily. Unsure of dose     . Doxylamine-Pyridoxine (DICLEGIS PO) Take 10 mg by mouth. Unsure of dose, takes only 1   12/03/2019 at Unknown time  . famotidine (PEPCID) 20 MG tablet Take 20 mg by mouth 2 (two) times daily.   12/03/2019 at Unknown time  . Prenatal Vit-Fe Fumarate-FA (PRENATAL MULTIVITAMIN) TABS tablet Take 1 tablet by mouth daily at 12 noon.   12/03/2019 at Unknown time  . zinc gluconate 50 MG tablet Take 50 mg by mouth daily.     Marland Kitchen acetaminophen (TYLENOL) 325 MG tablet Take 2 tablets (650 mg total) by mouth every 4 (four) hours as needed (for pain scale < 4). 30 tablet 0   . azithromycin (ZITHROMAX) 250 MG tablet Take 2 pills on first day, take one pill days 2-5 6 tablet 0   . ibuprofen (ADVIL) 600 MG tablet Take 1 tablet (600 mg total) by mouth every 6 (six) hours. 30 tablet 0   . nitrofurantoin, macrocrystal-monohydrate, (MACROBID) 100 MG capsule Take 1 capsule (100 mg total) by mouth 2 (two) times daily. 1 po BId 10 capsule 0     Review of Systems  Genitourinary: Positive for vaginal discharge.  All other systems reviewed and are negative.  Physical Exam   Blood pressure 129/66, pulse 93, temperature 98.7 F (37.1 C), temperature source Tympanic, resp. rate 20, height 5\' 7"  (1.702 m), weight 70.6 kg, SpO2 100 %, unknown if currently breastfeeding.  Physical Exam Vitals and nursing note reviewed. Exam conducted with a chaperone present.  Cardiovascular:     Rate and Rhythm: Normal rate.  Pulmonary:     Effort: Pulmonary effort is normal.  Abdominal:     Tenderness: There is left CVA tenderness. There is no right CVA tenderness.     Comments: Gravid  Genitourinary:    General: Normal vulva.     Vagina: Vaginal discharge present.     Comments: Pelvic exam: External genitalia normal, vaginal walls pink and well rugated, cervix visually closed, no lesions noted. Bimanual: cervix 0/thick/posterior, Thick white discharge throughout vault  Skin:    Capillary Refill: Capillary  refill takes less than 2 seconds.  Psychiatric:        Mood and Affect: Mood normal.     MAU Course/MDM  Procedures: sterile speculum exam, OB MFM  Limited  --Neg pooling, neg fern, nega Amnisure --Reactive tracing: baseline 140, mod var, + accels, no decels --Toco: quiet --Treat presumptively for yeast based on physical exam  Patient Vitals for the past 24 hrs:  BP Temp Temp src Pulse Resp SpO2 Height Weight  12/03/19 1125 129/66 98.7 F (37.1 C) Tympanic 93 20 100 % 5\' 7"  (1.702 m) 70.6 kg   Results for orders placed or performed during the hospital encounter of 12/03/19 (from the past 24 hour(s))  Urinalysis, Routine w reflex microscopic     Status: Abnormal   Collection Time: 12/03/19 12:17 PM  Result Value Ref Range   Color, Urine STRAW (A) YELLOW   APPearance CLEAR CLEAR   Specific Gravity, Urine 1.003 (L) 1.005 - 1.030   pH 7.0 5.0 - 8.0   Glucose, UA NEGATIVE NEGATIVE mg/dL   Hgb urine dipstick NEGATIVE NEGATIVE   Bilirubin Urine NEGATIVE NEGATIVE   Ketones, ur NEGATIVE NEGATIVE mg/dL   Protein, ur NEGATIVE NEGATIVE mg/dL   Nitrite NEGATIVE NEGATIVE   Leukocytes,Ua NEGATIVE NEGATIVE  Amnisure rupture of membrane (rom)not at [facility]     Status: None   Collection Time: 12/03/19 12:17 PM  Result Value Ref Range   Amnisure ROM NEGATIVE    Meds ordered this encounter  Medications  . terconazole (TERAZOL 7) 0.4 % vaginal cream    Sig: Place 1 applicator vaginally at bedtime. Use for seven days    Dispense:  45 g    Refill:  0    Order Specific Question:   Supervising Provider    Answer:   [facility] [[id number]]   Assessment and Plan  --24 y.o. G2P1001 at [redacted]w[redacted]d  --Reactive tracing, closed cervix --Intact amniotic sac --Abnormal discharge c/w Vulvovaginal Candidiasis --Discharge home in stable condition  22, CNM 12/03/2019, 4:20 PM

## 2020-02-05 ENCOUNTER — Telehealth: Payer: No Typology Code available for payment source | Admitting: Nurse Practitioner

## 2020-02-05 DIAGNOSIS — J020 Streptococcal pharyngitis: Secondary | ICD-10-CM

## 2020-02-05 MED ORDER — CEPHALEXIN 500 MG PO CAPS: 500.0000 mg | ORAL_CAPSULE | Freq: Two times a day (BID) | ORAL | 0 refills | Status: DC

## 2020-02-05 NOTE — Progress Notes (Signed)

## 2020-03-10 ENCOUNTER — Telehealth (HOSPITAL_COMMUNITY): Payer: Self-pay | Admitting: *Deleted

## 2020-03-10 ENCOUNTER — Encounter (HOSPITAL_COMMUNITY): Payer: Self-pay | Admitting: *Deleted

## 2020-03-10 NOTE — Telephone Encounter (Signed)
Preadmission screen  

## 2020-03-13 ENCOUNTER — Other Ambulatory Visit: Payer: Self-pay | Admitting: Obstetrics and Gynecology

## 2020-03-16 ENCOUNTER — Other Ambulatory Visit (HOSPITAL_COMMUNITY)
Admission: RE | Admit: 2020-03-16 | Discharge: 2020-03-16 | Disposition: A | Discharge status: Home / Self Care | Payer: BC Managed Care – PPO | Source: Ambulatory Visit | Attending: Obstetrics and Gynecology | Admitting: Obstetrics and Gynecology

## 2020-03-16 DIAGNOSIS — Z01812 Encounter for preprocedural laboratory examination: Secondary | ICD-10-CM | POA: Insufficient documentation

## 2020-03-16 DIAGNOSIS — Z20822 Contact with and (suspected) exposure to covid-19: Secondary | ICD-10-CM | POA: Insufficient documentation

## 2020-03-16 LAB — SARS CORONAVIRUS 2 (TAT 6-24 HRS): SARS Coronavirus 2: NEGATIVE

## 2020-03-18 ENCOUNTER — Inpatient Hospital Stay (HOSPITAL_COMMUNITY)
Admission: AD | Admit: 2020-03-18 | Discharge: 2020-03-19 | DRG: 807 | Disposition: A | Discharge status: Home / Self Care | Payer: BC Managed Care – PPO | Attending: Obstetrics and Gynecology | Admitting: Obstetrics and Gynecology

## 2020-03-18 ENCOUNTER — Other Ambulatory Visit: Payer: Self-pay

## 2020-03-18 ENCOUNTER — Inpatient Hospital Stay (HOSPITAL_COMMUNITY): Payer: BC Managed Care – PPO

## 2020-03-18 ENCOUNTER — Encounter (HOSPITAL_COMMUNITY): Payer: Self-pay | Admitting: Obstetrics and Gynecology

## 2020-03-18 DIAGNOSIS — Z3A39 39 weeks gestation of pregnancy: Secondary | ICD-10-CM

## 2020-03-18 DIAGNOSIS — O26893 Other specified pregnancy related conditions, third trimester: Secondary | ICD-10-CM | POA: Diagnosis present

## 2020-03-18 LAB — CBC
HCT: 35.5 % — ABNORMAL LOW (ref 36.0–46.0)
Hemoglobin: 11.5 g/dL — ABNORMAL LOW (ref 12.0–15.0)
MCH: 29.3 pg (ref 26.0–34.0)
MCHC: 32.4 g/dL (ref 30.0–36.0)
MCV: 90.3 fL (ref 80.0–100.0)
Platelets: 150 10*3/uL (ref 150–400)
RBC: 3.93 MIL/uL (ref 3.87–5.11)
RDW: 13.6 % (ref 11.5–15.5)
WBC: 8.3 10*3/uL (ref 4.0–10.5)
nRBC: 0 % (ref 0.0–0.2)

## 2020-03-18 LAB — TYPE AND SCREEN
ABO/RH(D): O POS
Antibody Screen: NEGATIVE

## 2020-03-18 LAB — RPR: RPR Ser Ql: NONREACTIVE

## 2020-03-18 MED ORDER — DIPHENHYDRAMINE HCL 25 MG PO CAPS: 25.0000 mg | ORAL_CAPSULE | Freq: Four times a day (QID) | ORAL | Status: DC | PRN

## 2020-03-18 MED ORDER — SIMETHICONE 80 MG PO CHEW: 80.0000 mg | CHEWABLE_TABLET | ORAL | Status: DC | PRN

## 2020-03-18 MED ORDER — TERBUTALINE SULFATE 1 MG/ML IJ SOLN: 0.2500 mg | Freq: Once | INTRAMUSCULAR | Status: DC | PRN

## 2020-03-18 MED ORDER — LACTATED RINGERS IV SOLN: 500.0000 mL | INTRAVENOUS | Status: DC | PRN

## 2020-03-18 MED ORDER — OXYTOCIN 10 UNIT/ML IJ SOLN: 10.0000 [IU] | Freq: Once | INTRAMUSCULAR | Status: DC

## 2020-03-18 MED ORDER — DIBUCAINE (PERIANAL) 1 % EX OINT: 1.0000 "application " | TOPICAL_OINTMENT | CUTANEOUS | Status: DC | PRN

## 2020-03-18 MED ORDER — MISOPROSTOL 25 MCG QUARTER TABLET
25.0000 ug | ORAL_TABLET | ORAL | Status: DC | PRN
  Administered 2020-03-18: 06:00:00 25 ug via VAGINAL
  Filled 2020-03-18: qty 1

## 2020-03-18 MED ORDER — SOD CITRATE-CITRIC ACID 500-334 MG/5ML PO SOLN: 30.0000 mL | ORAL | Status: DC | PRN

## 2020-03-18 MED ORDER — ONDANSETRON HCL 4 MG/2ML IJ SOLN: 4.0000 mg | INTRAMUSCULAR | Status: DC | PRN

## 2020-03-18 MED ORDER — LACTATED RINGERS IV SOLN: INTRAVENOUS | Status: DC

## 2020-03-18 MED ORDER — IBUPROFEN 600 MG PO TABS: 600.0000 mg | ORAL_TABLET | Freq: Four times a day (QID) | ORAL | Status: DC

## 2020-03-18 MED ORDER — ACETAMINOPHEN 325 MG PO TABS: 650.0000 mg | ORAL_TABLET | ORAL | Status: DC | PRN

## 2020-03-18 MED ORDER — OXYCODONE-ACETAMINOPHEN 5-325 MG PO TABS: 1.0000 | ORAL_TABLET | ORAL | Status: DC | PRN

## 2020-03-18 MED ORDER — WITCH HAZEL-GLYCERIN EX PADS: 1.0000 "application " | MEDICATED_PAD | CUTANEOUS | Status: DC | PRN

## 2020-03-18 MED ORDER — ONDANSETRON HCL 4 MG/2ML IJ SOLN: 4.0000 mg | Freq: Four times a day (QID) | INTRAMUSCULAR | Status: DC | PRN

## 2020-03-18 MED ORDER — OXYTOCIN BOLUS FROM INFUSION: 333.0000 mL | Freq: Once | INTRAVENOUS | Status: DC

## 2020-03-18 MED ORDER — TETANUS-DIPHTH-ACELL PERTUSSIS 5-2.5-18.5 LF-MCG/0.5 IM SUSY: 0.5000 mL | PREFILLED_SYRINGE | Freq: Once | INTRAMUSCULAR | Status: DC

## 2020-03-18 MED ORDER — BENZOCAINE-MENTHOL 20-0.5 % EX AERO
1.0000 "application " | INHALATION_SPRAY | CUTANEOUS | Status: DC | PRN
  Administered 2020-03-18: 1 via TOPICAL
  Filled 2020-03-18: qty 56

## 2020-03-18 MED ORDER — COCONUT OIL OIL: 1.0000 "application " | TOPICAL_OIL | Status: DC | PRN

## 2020-03-18 MED ORDER — LIDOCAINE HCL (PF) 1 % IJ SOLN
30.0000 mL | INTRAMUSCULAR | Status: AC | PRN
  Administered 2020-03-18: 30 mL via SUBCUTANEOUS
  Filled 2020-03-18: qty 30

## 2020-03-18 MED ORDER — PRENATAL MULTIVITAMIN CH: 1.0000 | ORAL_TABLET | Freq: Every day | ORAL | Status: DC

## 2020-03-18 MED ORDER — ONDANSETRON HCL 4 MG PO TABS: 4.0000 mg | ORAL_TABLET | ORAL | Status: DC | PRN

## 2020-03-18 MED ORDER — OXYTOCIN-SODIUM CHLORIDE 30-0.9 UT/500ML-% IV SOLN
1.0000 m[IU]/min | INTRAVENOUS | Status: DC
  Administered 2020-03-18: 11:00:00 2 m[IU]/min via INTRAVENOUS
  Filled 2020-03-18: qty 500

## 2020-03-18 MED ORDER — FLEET ENEMA 7-19 GM/118ML RE ENEM: 1.0000 | ENEMA | RECTAL | Status: DC | PRN

## 2020-03-18 MED ORDER — SENNOSIDES-DOCUSATE SODIUM 8.6-50 MG PO TABS: 2.0000 | ORAL_TABLET | Freq: Every day | ORAL | Status: DC

## 2020-03-18 MED ORDER — BUTORPHANOL TARTRATE 1 MG/ML IJ SOLN
1.0000 mg | INTRAMUSCULAR | Status: DC | PRN
Start: 2020-03-18 — End: 2020-03-18

## 2020-03-18 MED ORDER — OXYCODONE-ACETAMINOPHEN 5-325 MG PO TABS: 2.0000 | ORAL_TABLET | ORAL | Status: DC | PRN

## 2020-03-18 MED ORDER — OXYTOCIN 10 UNIT/ML IJ SOLN
INTRAMUSCULAR | Status: AC
  Filled 2020-03-18: qty 1

## 2020-03-18 MED ORDER — ZOLPIDEM TARTRATE 5 MG PO TABS
5.0000 mg | ORAL_TABLET | Freq: Every evening | ORAL | Status: DC | PRN
Start: 2020-03-18 — End: 2020-03-19

## 2020-03-18 MED ORDER — OXYTOCIN-SODIUM CHLORIDE 30-0.9 UT/500ML-% IV SOLN: 2.5000 [IU]/h | INTRAVENOUS | Status: DC

## 2020-03-18 NOTE — Progress Notes (Signed)
Labor Note  S: Painful contractions, continued leakage  O: BP 127/73   Pulse 73   Temp 97.9 F (36.6 C) (Oral)   Resp 16   Ht 5\' 7"  (1.702 m)   Wt 76.3 kg   BMI 26.36 kg/m  CE: 5/60/-2 FHR: Baseline 125, +accels, -decels, modvariability TOCO q1-4, pitocin at 49mU/min  A/P: This is a 24 y.o. G2P1001 at [redacted]w[redacted]d  admitted for IOL for favorable cervix at term FWB: Cat 1  MWB: declining epidural at this time Labor course: s/p clear AROM 0845, continue to titrate pitocin per protocol  Anticipate SVD

## 2020-03-18 NOTE — H&P (Signed)
Lori Porter is a 24 y.o. female presenting for scheudle dIOL. +FM, denies VB< LOF, has occ ctx  PNC c/b resolved LLP. GBS neg OB History    Gravida  2   Para  1   Term  1   Preterm      AB      Living  1     SAB      IAB      Ectopic      Multiple  0   Live Births  1          Past Medical History:  Diagnosis Date  . Constipation   . Gestational thrombocytopenia (HCC)   . Medical history non-contributory   . Scoliosis    mild-no intervention at present  . SVD (spontaneous vaginal delivery) 08/27/2018   Past Surgical History:  Procedure Laterality Date  . WISDOM TOOTH EXTRACTION Bilateral    Family History: family history is not on file. Social History:  reports that she has never smoked. She has never used smokeless tobacco. She reports that she does not drink alcohol and does not use drugs.     Maternal Diabetes: No 1hr 133 Genetic Screening: Normal Maternal Ultrasounds/Referrals: Normal Fetal Ultrasounds or other Referrals:  None Maternal Substance Abuse:  No Significant Maternal Medications:  None Significant Maternal Lab Results:  Group B Strep negative Other Comments:  None  Review of Systems  Constitutional: Negative for chills and fever.  Respiratory: Negative for shortness of breath.   Cardiovascular: Negative for chest pain, palpitations and leg swelling.  Gastrointestinal: Negative for abdominal pain and vomiting.  Neurological: Negative for dizziness, weakness and headaches.  Psychiatric/Behavioral: Negative for suicidal ideas.   History Dilation: 3 Effacement (%): 60 Station: -2 Exam by:: Sherol Dade RN Blood pressure 110/67, pulse 90, temperature 97.7 F (36.5 C), temperature source Oral, resp. rate 17, height 5\' 7"  (1.702 m), weight 76.3 kg, unknown if currently breastfeeding. Exam Physical Exam Constitutional:      General: She is not in acute distress.    Appearance: She is well-developed and well-nourished.   HENT:     Head: Normocephalic and atraumatic.  Eyes:     Pupils: Pupils are equal, round, and reactive to light.  Cardiovascular:     Rate and Rhythm: Normal rate and regular rhythm.     Heart sounds: No murmur heard. No gallop.   Abdominal:     Tenderness: There is no abdominal tenderness. There is no guarding or rebound.     Comments: COVID  Genitourinary:    Vagina: Normal.     Uterus: Normal.   Musculoskeletal:        General: Normal range of motion.     Cervical back: Normal range of motion and neck supple.  Skin:    General: Skin is warm and dry.  Neurological:     Mental Status: She is alert and oriented to person, place, and time.     Prenatal labs: ABO, Rh: --/--/O POS (12/18 11-14-1995) Antibody: NEG (12/18 0545) Rubella: Immune (05/26 0000) RPR:    HBsAg: Negative (05/26 0000)  HIV: Non-reactive (05/26 0000)  GBS:   neg  Assessment/Plan: This is a 24yo G2P1001 @ 39 1/7 by LMP c./w 9wk scan admitted for IOL for favorable cervix at term. GBS neg, baby girl. S?p clear AROM @ 0845, CE 3/60/-2. S/p cytotec x1 at 0600, initiate pitocin per protocl after appropriate time   3/7 03/18/2020, 8:53 AM

## 2020-03-18 NOTE — Plan of Care (Signed)

## 2020-03-18 NOTE — Lactation Note (Signed)
This note was copied from a baby's chart. Lactation Consultation Note  Patient Name: Lori Porter LHTDS'K Date: 03/18/2020 Reason for consult: Follow-up assessment;Term P2, 5 hour term female infant. Infant had 2 voids and one stool. Mom is experienced at breastfeeding she BF her 47 month old son for 11 months. Per mom, infant is breastfeeding well,no concerns for [facility] services at this time. Per mom, infant breastfed in L&D for one hour, 2nd time -20 minutes and recently 3rd time for 10 minutes. LC did not observe latch, infant asleep in basinet. Mom knows to BF according to cues, 8 to 12+ times within 24 hours. Mom knows to call RN or LC if she has any questions or concerns.  LC discussed infant's  input and output. LC suggested mom to attend the [facility] Breastfeeding support group after hospital discharge ( free) within the local community.  Mom made aware of O/P services, breastfeeding support groups, community resources, and our phone # for post-discharge questions.  Maternal Data Formula Feeding for Exclusion: No Does the patient have breastfeeding experience prior to this delivery?: Yes  Feeding Feeding Type: Breast Fed  LATCH Score Latch: Grasps breast easily, tongue down, lips flanged, rhythmical sucking.  Audible Swallowing: A few with stimulation  Type of Nipple: Everted at rest and after stimulation  Comfort (Breast/Nipple): Soft / non-tender  Hold (Positioning): No assistance needed to correctly position infant at breast.  LATCH Score: 9  Interventions Interventions: Breast feeding basics reviewed;Skin to skin  Lactation Tools Discussed/Used WIC Program: No   Consult Status Consult Status: Follow-up Date: 03/19/20 Follow-up type: In-patient    Lori Porter 03/18/2020, 8:27 PM

## 2020-03-18 NOTE — Progress Notes (Signed)
SVD baby girl skin to skin with mother 

## 2020-03-18 NOTE — Progress Notes (Signed)
Patient called out with pressure, now 6-7/80/-2, continue to titrate pitocin

## 2020-03-18 NOTE — Progress Notes (Signed)
CNM called to bedside for standby. Patient unmedicated and baby was crowning on CNM arrival. Infant delivered after 2 contractions, ROA with nuchal x1 reduced after delivery. Infant immediately skin to skin and Dr. Reina Fuse arrived at bedside. Report given to Dr. Reina Fuse.  Rolm Bookbinder, CNM 03/18/20 2:44 PM

## 2020-03-18 NOTE — Lactation Note (Signed)
Lactation Consultation Note  Patient Name: Lori Porter Date: 03/18/2020    Attempted LC visit after delivery. RN is assisting mother at the moment.  LC will come back to room at another time as possible.        Aran Menning A Higuera [place] 03/18/2020, 3:11 PM

## 2020-03-18 NOTE — Plan of Care (Signed)
°  Problem: Education: Goal: Knowledge of condition will improve Note: Admission education, safety and unit protocols reviewed with patient and significant other. Encouraged mother to call for assistance for the first time ambulating to the bathroom. Earl Gala, Linda Hedges [place]

## 2020-03-19 LAB — CBC
HCT: 30.5 % — ABNORMAL LOW (ref 36.0–46.0)
Hemoglobin: 10.1 g/dL — ABNORMAL LOW (ref 12.0–15.0)
MCH: 29.6 pg (ref 26.0–34.0)
MCHC: 33.1 g/dL (ref 30.0–36.0)
MCV: 89.4 fL (ref 80.0–100.0)
Platelets: 151 10*3/uL (ref 150–400)
RBC: 3.41 MIL/uL — ABNORMAL LOW (ref 3.87–5.11)
RDW: 13.4 % (ref 11.5–15.5)
WBC: 12.5 10*3/uL — ABNORMAL HIGH (ref 4.0–10.5)
nRBC: 0 % (ref 0.0–0.2)

## 2020-03-19 MED ORDER — IBUPROFEN 800 MG PO TABS: 800.0000 mg | ORAL_TABLET | Freq: Three times a day (TID) | ORAL | 1 refills | Status: AC | PRN

## 2020-03-19 NOTE — Progress Notes (Signed)
POSTPARTUM PROGRESS NOTE  Post Partum Day #1  Subjective:  No acute events overnight.  Pt denies problems with ambulating, voiding or po intake.  She denies nausea or vomiting.  Pain is well controlled.  Lochia Minimal. Baby breastfeeding and latching well  Objective: Blood pressure (!) 105/56, pulse 61, temperature 98.2 F (36.8 C), resp. rate 20, height 5\' 7"  (1.702 m), weight 76.3 kg, SpO2 99 %, unknown if currently breastfeeding.  Physical Exam:  General: alert, cooperative and no distress Lochia:normal flow Chest: CTAB Heart: RRR no m/r/g Abdomen: +BS, soft, nontender Uterine Fundus: firm, 2cm below umbilicus GU: suture intact, healing well, no purulent drainage Extremities: neg edema, neg calf TTP BL, neg Homans BL  Recent Labs    03/18/20 0552 03/19/20 0434  HGB 11.5* 10.1*  HCT 35.5* 30.5*    Assessment/Plan:  ASSESSMENT: Lori Porter is a 24 y.o. 30 s/p SVD @ [redacted]w[redacted]d. PNC c/b resolved LLP.   Discharge home and Breastfeeding   LOS: 1 day

## 2020-03-19 NOTE — Lactation Note (Signed)
This note was copied from a baby's chart. Lactation Consultation Note  Patient Name: Lori Porter LHTDS'K Date: 03/19/2020 Reason for consult: Follow-up assessment   P2, Ex BF.  6 lb 3 oz.  [redacted]w[redacted]d.  Mother states baby is sleepy but breastfed well earlier today. Mother hand expresses and gives volume to baby with each feeding. Encouraged her to continue and if baby continues to be sleepy to be asked to be set up with DEBP. Mother agreeable but feels breastfeeding is going well. Suggest calling as needed.    Maternal Data    Feeding Feeding Type: Breast Fed  LATCH Score                   Interventions Interventions: Breast feeding basics reviewed  Lactation Tools Discussed/Used     Consult Status Consult Status: Follow-up Date: 03/20/20 Follow-up type: In-patient    Dahlia Byes [facility] 03/19/2020, 2:34 PM

## 2020-03-19 NOTE — Discharge Summary (Signed)
Postpartum Discharge Summary  Date of Service updated     Patient Name: Lori Porter DOB: September 14, 1995 MRN: [id number]  Date of admission: 03/18/2020 Delivery date:03/18/2020  Delivering provider: Rolm Bookbinder  Date of discharge: 03/19/2020  Admitting diagnosis: [redacted] weeks gestation of pregnancy [Z3A.39] Intrauterine pregnancy: [redacted]w[redacted]d     Secondary diagnosis:  Active Problems:   [redacted] weeks gestation of pregnancy  Additional problems: none    Discharge diagnosis: Term Pregnancy Delivered                                              Post partum procedures:none Augmentation: AROM and Pitocin Complications: None  Hospital course: Induction of Labor With Vaginal Delivery   24 y.o. yo G2P2002 at [redacted]w[redacted]d was admitted to the hospital 03/18/2020 for induction of labor.  Indication for induction: Favorable cervix at term.  Patient had an uncomplicated labor course as follows: Membrane Rupture Time/Date: 8:51 AM ,03/18/2020   Delivery Method:Vaginal, Spontaneous  Episiotomy: None  Lacerations:  1st degree  Details of delivery can be found in separate delivery note.  Patient had a routine postpartum course. Patient is discharged home 03/19/20.  Newborn Data: Birth date:03/18/2020  Birth time:2:39 PM  Gender:Female  Living status:Living  Apgars:9 ,9  Weight:2985 g    T-DaP:Given prenatally Flu: Yes  Physical exam  Vitals:   03/18/20 1805 03/18/20 2045 03/19/20 0052 03/19/20 0444  BP: 112/68 (!) 101/59 (!) 101/56 (!) 105/56  Pulse: 73 (!) 103 80 61  Resp: 16 18 18 20   Temp: 98.3 F (36.8 C) 97.6 F (36.4 C) 98.2 F (36.8 C) 98.2 F (36.8 C)  TempSrc: Oral Oral Oral   SpO2: 97% 96% 98% 99%  Weight:      Height:       General: alert, cooperative and no distress Lochia: appropriate Uterine Fundus: firm Incision: N/A DVT Evaluation: No evidence of DVT seen on physical exam. Negative Homan's sign. No cords or calf tenderness. Labs: Lab Results  Component Value  Date   WBC 12.5 (H) 03/19/2020   HGB 10.1 (L) 03/19/2020   HCT 30.5 (L) 03/19/2020   MCV 89.4 03/19/2020   PLT 151 03/19/2020   CMP Latest Ref Rng & Units 11/05/2017  Glucose 65 - 99 mg/dL 89  BUN 6 - 20 mg/dL 13  Creatinine 01/05/2018 - 1.27 mg/dL 5.17  Sodium 0.01 - 749 mmol/L 141  Potassium 3.5 - 5.2 mmol/L 4.4  Chloride 96 - 106 mmol/L 102  CO2 20 - 29 mmol/L 21  Calcium 8.7 - 10.2 mg/dL 10.3(H)  Total Protein 6.0 - 8.5 g/dL 7.4  Total Bilirubin 0.0 - 1.2 mg/dL 0.6  Alkaline Phos 39 - 117 IU/L 62  AST 0 - 40 IU/L 24  ALT 0 - 32 IU/L 15   Edinburgh Score: Edinburgh Postnatal Depression Scale Screening Tool 03/18/2020  I have been able to laugh and see the funny side of things. (No Data)  I have looked forward with enjoyment to things. -  I have blamed myself unnecessarily when things went wrong. -  I have been anxious or worried for no good reason. -  I have felt scared or panicky for no good reason. -  Things have been getting on top of me. -  I have been so unhappy that I have had difficulty sleeping. -  I have felt sad  or miserable. -  I have been so unhappy that I have been crying. -  The thought of harming myself has occurred to me. Inocente Salles Postnatal Depression Scale Total -      After visit meds:  Allergies as of 03/19/2020      Reactions   Oseltamivir Phosphate Other (See Comments)   Hallucinations   Penicillins Hives   Amoxicillin Rash   Peanuts [nuts] Rash      Medication List    STOP taking these medications   azithromycin 250 MG tablet Commonly known as: ZITHROMAX   cephALEXin 500 MG capsule Commonly known as: Keflex   DICLEGIS PO   nitrofurantoin (macrocrystal-monohydrate) 100 MG capsule Commonly known as: Macrobid   terconazole 0.4 % vaginal cream Commonly known as: TERAZOL 7     TAKE these medications   famotidine 20 MG tablet Commonly known as: PEPCID Take 20 mg by mouth 2 (two) times daily.   ibuprofen 800 MG tablet Commonly  known as: ADVIL Take 1 tablet (800 mg total) by mouth every 8 (eight) hours as needed. What changed:   medication strength  how much to take  when to take this  reasons to take this   prenatal multivitamin Tabs tablet Take 1 tablet by mouth daily at 12 noon.   vitamin C 100 MG tablet Take 100 mg by mouth daily. Unsure of dose   zinc gluconate 50 MG tablet Take 50 mg by mouth daily.        Discharge home in stable condition Infant Feeding: Breast Infant Disposition:home with mother Discharge instruction: per After Visit Summary and Postpartum booklet. Activity: Advance as tolerated. Pelvic rest for 6 weeks.  Diet: routine diet Anticipated Birth Control: Unsure Postpartum Appointment:6 weeks Future Appointments:No future appointments. Follow up Visit:      03/19/2020 Carlisle Cater, MD
